# Patient Record
Sex: Female | Born: 1935 | Race: White | Hispanic: No | Marital: Married | Smoking: Former smoker
Health system: Southern US, Community
[De-identification: ages and names within clinical notes are randomized; demographics above are authoritative.]

## PROBLEM LIST (undated history)

## (undated) DIAGNOSIS — M1990 Unspecified osteoarthritis, unspecified site: Secondary | ICD-10-CM

## (undated) DIAGNOSIS — H9190 Unspecified hearing loss, unspecified ear: Secondary | ICD-10-CM

## (undated) DIAGNOSIS — I340 Nonrheumatic mitral (valve) insufficiency: Secondary | ICD-10-CM

## (undated) DIAGNOSIS — H3530 Unspecified macular degeneration: Secondary | ICD-10-CM

## (undated) DIAGNOSIS — F419 Anxiety disorder, unspecified: Secondary | ICD-10-CM

## (undated) DIAGNOSIS — I2510 Atherosclerotic heart disease of native coronary artery without angina pectoris: Secondary | ICD-10-CM

## (undated) DIAGNOSIS — I519 Heart disease, unspecified: Secondary | ICD-10-CM

## (undated) DIAGNOSIS — I779 Disorder of arteries and arterioles, unspecified: Secondary | ICD-10-CM

## (undated) DIAGNOSIS — E875 Hyperkalemia: Secondary | ICD-10-CM

## (undated) DIAGNOSIS — I442 Atrioventricular block, complete: Secondary | ICD-10-CM

## (undated) DIAGNOSIS — Z950 Presence of cardiac pacemaker: Secondary | ICD-10-CM

## (undated) DIAGNOSIS — R55 Syncope and collapse: Secondary | ICD-10-CM

## (undated) DIAGNOSIS — E785 Hyperlipidemia, unspecified: Secondary | ICD-10-CM

## (undated) DIAGNOSIS — I739 Peripheral vascular disease, unspecified: Secondary | ICD-10-CM

## (undated) HISTORY — DX: Disorder of arteries and arterioles, unspecified: I77.9

## (undated) HISTORY — DX: Presence of cardiac pacemaker: Z95.0

## (undated) HISTORY — DX: Nonrheumatic mitral (valve) insufficiency: I34.0

## (undated) HISTORY — PX: PACEMAKER PLACEMENT: SHX43

## (undated) HISTORY — DX: Unspecified osteoarthritis, unspecified site: M19.90

## (undated) HISTORY — DX: Atrioventricular block, complete: I44.2

## (undated) HISTORY — DX: Hyperlipidemia, unspecified: E78.5

## (undated) HISTORY — DX: Hyperkalemia: E87.5

## (undated) HISTORY — DX: Anxiety disorder, unspecified: F41.9

## (undated) HISTORY — DX: Syncope and collapse: R55

## (undated) HISTORY — DX: Unspecified macular degeneration: H35.30

## (undated) HISTORY — DX: Peripheral vascular disease, unspecified: I73.9

## (undated) HISTORY — DX: Atherosclerotic heart disease of native coronary artery without angina pectoris: I25.10

## (undated) HISTORY — DX: Unspecified hearing loss, unspecified ear: H91.90

## (undated) HISTORY — DX: Heart disease, unspecified: I51.9

## (undated) HISTORY — PX: ABDOMINAL HYSTERECTOMY: SHX81

---

## 1993-06-03 HISTORY — PX: CORONARY ARTERY BYPASS GRAFT: SHX141

## 1993-06-03 HISTORY — PX: CAROTID ENDARTERECTOMY: SUR193

## 2000-06-04 ENCOUNTER — Ambulatory Visit (HOSPITAL_COMMUNITY): Admission: RE | Admit: 2000-06-04 | Discharge: 2000-06-04 | Payer: Self-pay | Admitting: Interventional Cardiology

## 2000-06-04 ENCOUNTER — Encounter: Payer: Self-pay | Admitting: Interventional Cardiology

## 2000-06-06 ENCOUNTER — Inpatient Hospital Stay (HOSPITAL_COMMUNITY)
Admission: AD | Admit: 2000-06-06 | Discharge: 2000-06-13 | Payer: Self-pay | Admitting: Thoracic Surgery (Cardiothoracic Vascular Surgery)

## 2000-06-06 ENCOUNTER — Encounter: Payer: Self-pay | Admitting: Surgery

## 2000-06-06 ENCOUNTER — Encounter: Payer: Self-pay | Admitting: Thoracic Surgery (Cardiothoracic Vascular Surgery)

## 2000-06-07 ENCOUNTER — Encounter: Payer: Self-pay | Admitting: Thoracic Surgery (Cardiothoracic Vascular Surgery)

## 2000-06-08 ENCOUNTER — Encounter: Payer: Self-pay | Admitting: Thoracic Surgery (Cardiothoracic Vascular Surgery)

## 2000-06-09 ENCOUNTER — Encounter: Payer: Self-pay | Admitting: Surgery

## 2000-06-10 ENCOUNTER — Encounter: Payer: Self-pay | Admitting: Thoracic Surgery (Cardiothoracic Vascular Surgery)

## 2000-06-11 ENCOUNTER — Encounter: Payer: Self-pay | Admitting: Thoracic Surgery (Cardiothoracic Vascular Surgery)

## 2004-03-15 ENCOUNTER — Encounter: Admission: RE | Admit: 2004-03-15 | Discharge: 2004-03-15 | Payer: Self-pay | Admitting: Internal Medicine

## 2004-05-02 ENCOUNTER — Ambulatory Visit: Payer: Self-pay

## 2004-05-24 ENCOUNTER — Ambulatory Visit: Payer: Self-pay

## 2004-06-23 ENCOUNTER — Ambulatory Visit: Payer: Self-pay | Admitting: Internal Medicine

## 2004-07-17 ENCOUNTER — Ambulatory Visit: Payer: Self-pay | Admitting: Internal Medicine

## 2004-08-06 ENCOUNTER — Ambulatory Visit: Payer: Self-pay | Admitting: Internal Medicine

## 2004-08-17 ENCOUNTER — Ambulatory Visit: Payer: Self-pay | Admitting: Internal Medicine

## 2004-10-24 ENCOUNTER — Ambulatory Visit: Payer: Self-pay | Admitting: Internal Medicine

## 2004-11-27 ENCOUNTER — Ambulatory Visit: Payer: Self-pay | Admitting: Internal Medicine

## 2004-12-03 ENCOUNTER — Ambulatory Visit: Payer: Self-pay | Admitting: Internal Medicine

## 2005-01-04 ENCOUNTER — Ambulatory Visit: Payer: Self-pay | Admitting: Internal Medicine

## 2005-01-09 ENCOUNTER — Ambulatory Visit: Payer: Self-pay | Admitting: Internal Medicine

## 2005-02-13 ENCOUNTER — Ambulatory Visit: Payer: Self-pay | Admitting: Internal Medicine

## 2005-02-18 ENCOUNTER — Ambulatory Visit: Payer: Self-pay | Admitting: Internal Medicine

## 2005-03-01 ENCOUNTER — Ambulatory Visit: Payer: Self-pay | Admitting: Internal Medicine

## 2005-03-06 ENCOUNTER — Ambulatory Visit: Payer: Self-pay

## 2005-03-11 ENCOUNTER — Ambulatory Visit: Payer: Self-pay | Admitting: Internal Medicine

## 2005-03-29 ENCOUNTER — Ambulatory Visit: Payer: Self-pay | Admitting: Internal Medicine

## 2005-05-02 ENCOUNTER — Ambulatory Visit: Payer: Self-pay | Admitting: Internal Medicine

## 2005-06-03 HISTORY — PX: CORONARY ANGIOPLASTY: SHX604

## 2005-06-05 ENCOUNTER — Ambulatory Visit: Payer: Self-pay | Admitting: Internal Medicine

## 2005-06-21 ENCOUNTER — Encounter: Admission: RE | Admit: 2005-06-21 | Discharge: 2005-06-21 | Payer: Self-pay | Admitting: Internal Medicine

## 2005-07-10 ENCOUNTER — Ambulatory Visit: Payer: Self-pay | Admitting: Internal Medicine

## 2005-07-29 ENCOUNTER — Ambulatory Visit: Payer: Self-pay | Admitting: Internal Medicine

## 2005-08-02 ENCOUNTER — Ambulatory Visit: Payer: Self-pay | Admitting: Internal Medicine

## 2005-08-09 ENCOUNTER — Ambulatory Visit: Payer: Self-pay | Admitting: Internal Medicine

## 2005-08-15 ENCOUNTER — Ambulatory Visit: Payer: Self-pay | Admitting: Internal Medicine

## 2005-08-15 ENCOUNTER — Encounter: Payer: Self-pay | Admitting: Cardiology

## 2005-08-15 ENCOUNTER — Ambulatory Visit: Payer: Self-pay

## 2005-09-02 ENCOUNTER — Ambulatory Visit: Payer: Self-pay | Admitting: Internal Medicine

## 2005-10-02 ENCOUNTER — Ambulatory Visit: Payer: Self-pay | Admitting: Internal Medicine

## 2005-11-05 ENCOUNTER — Ambulatory Visit: Payer: Self-pay | Admitting: Internal Medicine

## 2005-12-06 ENCOUNTER — Ambulatory Visit: Payer: Self-pay | Admitting: Internal Medicine

## 2005-12-10 ENCOUNTER — Ambulatory Visit: Payer: Self-pay | Admitting: Internal Medicine

## 2005-12-12 ENCOUNTER — Ambulatory Visit: Payer: Self-pay | Admitting: Internal Medicine

## 2006-01-02 ENCOUNTER — Ambulatory Visit: Payer: Self-pay | Admitting: Internal Medicine

## 2006-02-14 ENCOUNTER — Ambulatory Visit: Payer: Self-pay | Admitting: Internal Medicine

## 2006-03-17 ENCOUNTER — Ambulatory Visit: Payer: Self-pay | Admitting: Internal Medicine

## 2006-04-14 ENCOUNTER — Ambulatory Visit: Payer: Self-pay | Admitting: Internal Medicine

## 2006-05-12 ENCOUNTER — Ambulatory Visit: Payer: Self-pay | Admitting: Internal Medicine

## 2006-05-22 ENCOUNTER — Ambulatory Visit: Payer: Self-pay

## 2006-05-23 ENCOUNTER — Ambulatory Visit: Payer: Self-pay | Admitting: Internal Medicine

## 2006-05-29 ENCOUNTER — Ambulatory Visit: Payer: Self-pay | Admitting: Cardiovascular Disease

## 2006-05-29 ENCOUNTER — Inpatient Hospital Stay (HOSPITAL_BASED_OUTPATIENT_CLINIC_OR_DEPARTMENT_OTHER): Admission: RE | Admit: 2006-05-29 | Discharge: 2006-05-29 | Payer: Self-pay | Admitting: Cardiovascular Disease

## 2006-06-09 ENCOUNTER — Ambulatory Visit: Payer: Self-pay | Admitting: Internal Medicine

## 2006-06-11 ENCOUNTER — Ambulatory Visit: Payer: Self-pay | Admitting: Cardiology

## 2006-07-01 ENCOUNTER — Ambulatory Visit: Payer: Self-pay | Admitting: Internal Medicine

## 2006-07-01 LAB — CONVERTED CEMR LAB
Chloride: 104 meq/L (ref 96–112)
Eosinophils Absolute: 0.2 10*3/uL (ref 0.0–0.6)
Eosinophils Relative: 2.5 % (ref 0.0–5.0)
GFR calc non Af Amer: 56 mL/min
Glucose, Bld: 87 mg/dL (ref 70–99)
HCT: 35.1 % — ABNORMAL LOW (ref 36.0–46.0)
Hemoglobin: 12.1 g/dL (ref 12.0–15.0)
Lymphocytes Relative: 27.5 % (ref 12.0–46.0)
MCV: 89.8 fL (ref 78.0–100.0)
Monocytes Absolute: 0.7 10*3/uL (ref 0.2–0.7)
Neutrophils Relative %: 60.5 % (ref 43.0–77.0)
Platelets: 259 10*3/uL (ref 150–400)
Potassium: 4.6 meq/L (ref 3.5–5.1)
RBC: 3.91 M/uL (ref 3.87–5.11)
Sodium: 139 meq/L (ref 135–145)
WBC: 7.8 10*3/uL (ref 4.5–10.5)

## 2006-07-07 ENCOUNTER — Ambulatory Visit: Payer: Self-pay | Admitting: Internal Medicine

## 2006-07-08 ENCOUNTER — Encounter: Admission: RE | Admit: 2006-07-08 | Discharge: 2006-07-08 | Payer: Self-pay | Admitting: Internal Medicine

## 2006-07-21 ENCOUNTER — Ambulatory Visit: Payer: Self-pay | Admitting: Internal Medicine

## 2006-08-04 ENCOUNTER — Ambulatory Visit: Payer: Self-pay | Admitting: Internal Medicine

## 2006-09-01 ENCOUNTER — Ambulatory Visit: Payer: Self-pay | Admitting: Internal Medicine

## 2006-09-12 ENCOUNTER — Ambulatory Visit: Payer: Self-pay | Admitting: Internal Medicine

## 2006-09-29 ENCOUNTER — Ambulatory Visit: Payer: Self-pay | Admitting: Internal Medicine

## 2006-10-24 ENCOUNTER — Ambulatory Visit: Payer: Self-pay | Admitting: Internal Medicine

## 2006-10-24 ENCOUNTER — Encounter: Admission: RE | Admit: 2006-10-24 | Discharge: 2006-10-24 | Payer: Self-pay | Admitting: Internal Medicine

## 2006-10-30 ENCOUNTER — Ambulatory Visit: Payer: Self-pay | Admitting: Internal Medicine

## 2006-11-19 ENCOUNTER — Ambulatory Visit: Payer: Self-pay | Admitting: Family Medicine

## 2006-11-24 ENCOUNTER — Ambulatory Visit: Payer: Self-pay | Admitting: Internal Medicine

## 2006-12-24 ENCOUNTER — Ambulatory Visit: Payer: Self-pay | Admitting: Internal Medicine

## 2006-12-24 DIAGNOSIS — I10 Essential (primary) hypertension: Secondary | ICD-10-CM | POA: Insufficient documentation

## 2006-12-24 DIAGNOSIS — I2510 Atherosclerotic heart disease of native coronary artery without angina pectoris: Secondary | ICD-10-CM | POA: Insufficient documentation

## 2006-12-24 DIAGNOSIS — M1990 Unspecified osteoarthritis, unspecified site: Secondary | ICD-10-CM | POA: Insufficient documentation

## 2006-12-26 ENCOUNTER — Encounter: Payer: Self-pay | Admitting: Internal Medicine

## 2007-01-19 ENCOUNTER — Ambulatory Visit: Payer: Self-pay | Admitting: Internal Medicine

## 2007-01-29 ENCOUNTER — Ambulatory Visit: Payer: Self-pay | Admitting: Internal Medicine

## 2007-02-03 ENCOUNTER — Telehealth (INDEPENDENT_AMBULATORY_CARE_PROVIDER_SITE_OTHER): Payer: Self-pay | Admitting: *Deleted

## 2007-02-09 ENCOUNTER — Telehealth: Payer: Self-pay | Admitting: Internal Medicine

## 2007-02-16 ENCOUNTER — Ambulatory Visit: Payer: Self-pay | Admitting: Internal Medicine

## 2007-03-02 ENCOUNTER — Telehealth: Payer: Self-pay | Admitting: Internal Medicine

## 2007-03-18 ENCOUNTER — Ambulatory Visit: Payer: Self-pay | Admitting: Internal Medicine

## 2007-03-19 ENCOUNTER — Telehealth: Payer: Self-pay | Admitting: Internal Medicine

## 2007-03-20 ENCOUNTER — Ambulatory Visit: Payer: Self-pay | Admitting: Internal Medicine

## 2007-03-20 DIAGNOSIS — R109 Unspecified abdominal pain: Secondary | ICD-10-CM | POA: Insufficient documentation

## 2007-03-20 LAB — CONVERTED CEMR LAB
Ketones, urine, test strip: NEGATIVE
Nitrite: NEGATIVE
Specific Gravity, Urine: <1.005
Urobilinogen, UA: 0.2

## 2007-04-13 ENCOUNTER — Ambulatory Visit: Payer: Self-pay | Admitting: Internal Medicine

## 2007-04-22 ENCOUNTER — Ambulatory Visit: Payer: Self-pay | Admitting: Internal Medicine

## 2007-04-23 ENCOUNTER — Ambulatory Visit: Payer: Self-pay | Admitting: Internal Medicine

## 2007-05-06 ENCOUNTER — Telehealth: Payer: Self-pay | Admitting: Internal Medicine

## 2007-05-07 ENCOUNTER — Telehealth: Payer: Self-pay | Admitting: *Deleted

## 2007-05-11 ENCOUNTER — Ambulatory Visit: Payer: Self-pay | Admitting: Internal Medicine

## 2007-05-19 ENCOUNTER — Telehealth: Payer: Self-pay | Admitting: Internal Medicine

## 2007-06-08 ENCOUNTER — Ambulatory Visit: Payer: Self-pay | Admitting: Internal Medicine

## 2007-06-10 ENCOUNTER — Ambulatory Visit: Payer: Self-pay | Admitting: Internal Medicine

## 2007-06-10 DIAGNOSIS — Z950 Presence of cardiac pacemaker: Secondary | ICD-10-CM | POA: Insufficient documentation

## 2007-06-10 DIAGNOSIS — R209 Unspecified disturbances of skin sensation: Secondary | ICD-10-CM

## 2007-06-10 DIAGNOSIS — Z951 Presence of aortocoronary bypass graft: Secondary | ICD-10-CM

## 2007-06-19 ENCOUNTER — Ambulatory Visit: Payer: Self-pay | Admitting: Internal Medicine

## 2007-07-06 ENCOUNTER — Telehealth: Payer: Self-pay | Admitting: Internal Medicine

## 2007-07-13 ENCOUNTER — Ambulatory Visit: Payer: Self-pay | Admitting: Internal Medicine

## 2007-07-14 ENCOUNTER — Telehealth: Payer: Self-pay | Admitting: Internal Medicine

## 2007-07-15 ENCOUNTER — Telehealth: Payer: Self-pay | Admitting: Internal Medicine

## 2007-07-21 ENCOUNTER — Ambulatory Visit: Payer: Self-pay | Admitting: Internal Medicine

## 2007-07-27 ENCOUNTER — Encounter: Payer: Self-pay | Admitting: Internal Medicine

## 2007-07-30 ENCOUNTER — Encounter: Admission: RE | Admit: 2007-07-30 | Discharge: 2007-07-30 | Payer: Self-pay | Admitting: Orthopedic Surgery

## 2007-08-03 ENCOUNTER — Encounter: Payer: Self-pay | Admitting: Internal Medicine

## 2007-09-01 ENCOUNTER — Encounter: Admission: RE | Admit: 2007-09-01 | Discharge: 2007-09-01 | Payer: Self-pay | Admitting: Internal Medicine

## 2007-09-07 ENCOUNTER — Ambulatory Visit: Payer: Self-pay | Admitting: Internal Medicine

## 2007-09-08 ENCOUNTER — Telehealth: Payer: Self-pay | Admitting: Internal Medicine

## 2007-09-14 ENCOUNTER — Telehealth: Payer: Self-pay | Admitting: *Deleted

## 2007-09-25 ENCOUNTER — Ambulatory Visit: Payer: Self-pay | Admitting: Internal Medicine

## 2007-09-25 DIAGNOSIS — R1031 Right lower quadrant pain: Secondary | ICD-10-CM

## 2007-09-25 DIAGNOSIS — K5900 Constipation, unspecified: Secondary | ICD-10-CM | POA: Insufficient documentation

## 2007-10-06 ENCOUNTER — Ambulatory Visit: Payer: Self-pay | Admitting: Internal Medicine

## 2007-10-06 LAB — CONVERTED CEMR LAB
Albumin: 3.9 g/dL (ref 3.5–5.2)
Basophils Absolute: 0 10*3/uL (ref 0.0–0.1)
CO2: 33 meq/L — ABNORMAL HIGH (ref 19–32)
Chloride: 107 meq/L (ref 96–112)
Creatinine, Ser: 1.1 mg/dL (ref 0.4–1.2)
Eosinophils Relative: 2.1 % (ref 0.0–5.0)
Glucose, Bld: 112 mg/dL — ABNORMAL HIGH (ref 70–99)
HCT: 35.5 % — ABNORMAL LOW (ref 36.0–46.0)
MCHC: 33.9 g/dL (ref 30.0–36.0)
MCV: 92.3 fL (ref 78.0–100.0)
Monocytes Relative: 7.2 % (ref 3.0–12.0)
Nitrite: NEGATIVE
Platelets: 266 10*3/uL (ref 150–400)
Potassium: 4.8 meq/L (ref 3.5–5.1)
RBC: 3.84 M/uL — ABNORMAL LOW (ref 3.87–5.11)
RDW: 12.5 % (ref 11.5–14.6)
Sodium: 144 meq/L (ref 135–145)
Total Bilirubin: 1 mg/dL (ref 0.3–1.2)
Urobilinogen, UA: 0.2
WBC: 7.6 10*3/uL (ref 4.5–10.5)
pH: 5.5

## 2007-10-12 ENCOUNTER — Telehealth: Payer: Self-pay | Admitting: *Deleted

## 2007-10-14 LAB — CONVERTED CEMR LAB: Vit D, 1,25-Dihydroxy: 34 (ref 30–89)

## 2007-10-29 ENCOUNTER — Telehealth: Payer: Self-pay | Admitting: *Deleted

## 2007-12-07 ENCOUNTER — Ambulatory Visit: Payer: Self-pay | Admitting: Internal Medicine

## 2007-12-08 ENCOUNTER — Telehealth: Payer: Self-pay | Admitting: Internal Medicine

## 2007-12-25 ENCOUNTER — Telehealth: Payer: Self-pay | Admitting: Internal Medicine

## 2008-01-05 ENCOUNTER — Telehealth: Payer: Self-pay | Admitting: Internal Medicine

## 2008-01-07 ENCOUNTER — Ambulatory Visit: Payer: Self-pay | Admitting: Internal Medicine

## 2008-01-07 DIAGNOSIS — R5381 Other malaise: Secondary | ICD-10-CM

## 2008-01-07 DIAGNOSIS — R5383 Other fatigue: Secondary | ICD-10-CM

## 2008-01-14 ENCOUNTER — Ambulatory Visit: Payer: Self-pay

## 2008-01-16 ENCOUNTER — Encounter: Payer: Self-pay | Admitting: Internal Medicine

## 2008-01-18 ENCOUNTER — Telehealth: Payer: Self-pay | Admitting: Internal Medicine

## 2008-01-28 ENCOUNTER — Encounter: Payer: Self-pay | Admitting: Internal Medicine

## 2008-02-09 ENCOUNTER — Ambulatory Visit: Payer: Self-pay | Admitting: Internal Medicine

## 2008-02-09 LAB — CONVERTED CEMR LAB
Basophils Relative: 0.5 % (ref 0.0–3.0)
Eosinophils Absolute: 0.2 10*3/uL (ref 0.0–0.7)
Eosinophils Relative: 1.9 % (ref 0.0–5.0)
Hemoglobin: 12.4 g/dL (ref 12.0–15.0)
Lymphocytes Relative: 31.2 % (ref 12.0–46.0)
Monocytes Relative: 6.5 % (ref 3.0–12.0)
RBC: 3.94 M/uL (ref 3.87–5.11)
RDW: 12 % (ref 11.5–14.6)
WBC: 9.4 10*3/uL (ref 4.5–10.5)

## 2008-02-15 ENCOUNTER — Telehealth: Payer: Self-pay | Admitting: Internal Medicine

## 2008-02-29 ENCOUNTER — Telehealth (INDEPENDENT_AMBULATORY_CARE_PROVIDER_SITE_OTHER): Payer: Self-pay | Admitting: *Deleted

## 2008-03-07 ENCOUNTER — Ambulatory Visit: Payer: Self-pay | Admitting: Internal Medicine

## 2008-03-17 ENCOUNTER — Ambulatory Visit: Payer: Self-pay

## 2008-04-26 ENCOUNTER — Telehealth: Payer: Self-pay | Admitting: Internal Medicine

## 2008-05-11 ENCOUNTER — Ambulatory Visit: Payer: Self-pay | Admitting: Internal Medicine

## 2008-05-31 ENCOUNTER — Telehealth: Payer: Self-pay | Admitting: *Deleted

## 2008-06-06 ENCOUNTER — Ambulatory Visit: Payer: Self-pay | Admitting: Internal Medicine

## 2008-06-21 ENCOUNTER — Ambulatory Visit: Payer: Self-pay

## 2008-06-21 ENCOUNTER — Ambulatory Visit: Payer: Self-pay | Admitting: Internal Medicine

## 2008-06-21 DIAGNOSIS — F4322 Adjustment disorder with anxiety: Secondary | ICD-10-CM

## 2008-07-06 ENCOUNTER — Ambulatory Visit: Payer: Self-pay | Admitting: Internal Medicine

## 2008-08-24 ENCOUNTER — Encounter: Payer: Self-pay | Admitting: Internal Medicine

## 2008-08-31 ENCOUNTER — Ambulatory Visit: Payer: Self-pay

## 2008-09-06 ENCOUNTER — Ambulatory Visit: Payer: Self-pay | Admitting: Internal Medicine

## 2008-09-06 ENCOUNTER — Encounter: Payer: Self-pay | Admitting: Internal Medicine

## 2008-09-06 LAB — CONVERTED CEMR LAB
AST: 24 units/L (ref 0–37)
Alkaline Phosphatase: 74 units/L (ref 39–117)
Amylase: 92 units/L (ref 27–131)
BUN: 25 mg/dL — ABNORMAL HIGH (ref 6–23)
Basophils Absolute: 0.3 10*3/uL — ABNORMAL HIGH (ref 0.0–0.1)
Basophils Relative: 3.3 % — ABNORMAL HIGH (ref 0.0–3.0)
CO2: 32 meq/L (ref 19–32)
Calcium: 9.3 mg/dL (ref 8.4–10.5)
Eosinophils Absolute: 0.2 10*3/uL (ref 0.0–0.7)
Eosinophils Relative: 2.5 % (ref 0.0–5.0)
GFR calc non Af Amer: 55.89 mL/min (ref >60)
HCT: 35.7 % — ABNORMAL LOW (ref 36.0–46.0)
Hemoglobin: 12.1 g/dL (ref 12.0–15.0)
Lipase: 33 units/L (ref 11.0–59.0)
Neutrophils Relative %: 59.2 % (ref 43.0–77.0)
Platelets: 233 10*3/uL (ref 150.0–400.0)
Prothrombin Time: 11.9 s (ref 10.9–13.3)
Total Protein: 6.8 g/dL (ref 6.0–8.3)
WBC: 8.6 10*3/uL (ref 4.5–10.5)
aPTT: 31.2 s — ABNORMAL HIGH (ref 21.7–28.8)

## 2008-09-13 ENCOUNTER — Telehealth: Payer: Self-pay | Admitting: Internal Medicine

## 2008-09-14 ENCOUNTER — Ambulatory Visit: Payer: Self-pay | Admitting: Internal Medicine

## 2008-09-14 ENCOUNTER — Ambulatory Visit (HOSPITAL_COMMUNITY): Admission: RE | Admit: 2008-09-14 | Discharge: 2008-09-14 | Payer: Self-pay | Admitting: Internal Medicine

## 2008-09-15 ENCOUNTER — Encounter: Payer: Self-pay | Admitting: Internal Medicine

## 2008-09-28 ENCOUNTER — Ambulatory Visit: Payer: Self-pay

## 2008-09-28 ENCOUNTER — Encounter: Payer: Self-pay | Admitting: Internal Medicine

## 2008-10-04 ENCOUNTER — Ambulatory Visit: Payer: Self-pay | Admitting: Internal Medicine

## 2008-10-05 ENCOUNTER — Telehealth (INDEPENDENT_AMBULATORY_CARE_PROVIDER_SITE_OTHER): Payer: Self-pay | Admitting: *Deleted

## 2008-10-06 ENCOUNTER — Ambulatory Visit: Payer: Self-pay | Admitting: Internal Medicine

## 2008-10-11 ENCOUNTER — Telehealth: Payer: Self-pay | Admitting: Internal Medicine

## 2008-11-21 ENCOUNTER — Ambulatory Visit: Payer: Self-pay | Admitting: Internal Medicine

## 2008-11-21 DIAGNOSIS — L259 Unspecified contact dermatitis, unspecified cause: Secondary | ICD-10-CM | POA: Insufficient documentation

## 2008-12-06 ENCOUNTER — Telehealth: Payer: Self-pay | Admitting: Internal Medicine

## 2008-12-20 ENCOUNTER — Telehealth: Payer: Self-pay | Admitting: Internal Medicine

## 2009-01-09 ENCOUNTER — Telehealth: Payer: Self-pay | Admitting: *Deleted

## 2009-01-16 ENCOUNTER — Encounter: Payer: Self-pay | Admitting: Internal Medicine

## 2009-01-31 ENCOUNTER — Ambulatory Visit: Payer: Self-pay | Admitting: Internal Medicine

## 2009-01-31 DIAGNOSIS — I359 Nonrheumatic aortic valve disorder, unspecified: Secondary | ICD-10-CM | POA: Insufficient documentation

## 2009-02-02 ENCOUNTER — Telehealth: Payer: Self-pay | Admitting: Internal Medicine

## 2009-02-07 ENCOUNTER — Telehealth: Payer: Self-pay | Admitting: *Deleted

## 2009-02-14 ENCOUNTER — Encounter: Payer: Self-pay | Admitting: Internal Medicine

## 2009-02-14 ENCOUNTER — Ambulatory Visit: Payer: Self-pay

## 2009-02-16 ENCOUNTER — Telehealth: Payer: Self-pay | Admitting: Internal Medicine

## 2009-02-28 ENCOUNTER — Telehealth: Payer: Self-pay | Admitting: *Deleted

## 2009-03-31 ENCOUNTER — Ambulatory Visit: Payer: Self-pay | Admitting: Internal Medicine

## 2009-04-07 ENCOUNTER — Telehealth: Payer: Self-pay | Admitting: *Deleted

## 2009-04-24 ENCOUNTER — Ambulatory Visit: Payer: Self-pay | Admitting: Internal Medicine

## 2009-04-25 ENCOUNTER — Encounter (INDEPENDENT_AMBULATORY_CARE_PROVIDER_SITE_OTHER): Payer: Self-pay | Admitting: *Deleted

## 2009-05-01 ENCOUNTER — Telehealth: Payer: Self-pay | Admitting: Internal Medicine

## 2009-05-03 HISTORY — PX: OTHER SURGICAL HISTORY: SHX169

## 2009-05-09 ENCOUNTER — Telehealth: Payer: Self-pay | Admitting: Internal Medicine

## 2009-05-10 ENCOUNTER — Encounter: Payer: Self-pay | Admitting: Internal Medicine

## 2009-06-06 ENCOUNTER — Telehealth (INDEPENDENT_AMBULATORY_CARE_PROVIDER_SITE_OTHER): Payer: Self-pay | Admitting: *Deleted

## 2009-06-29 ENCOUNTER — Encounter: Payer: Self-pay | Admitting: Internal Medicine

## 2009-06-29 ENCOUNTER — Ambulatory Visit: Payer: Self-pay

## 2009-08-28 ENCOUNTER — Telehealth: Payer: Self-pay | Admitting: Internal Medicine

## 2009-09-12 ENCOUNTER — Ambulatory Visit: Payer: Self-pay | Admitting: Internal Medicine

## 2009-09-12 DIAGNOSIS — I498 Other specified cardiac arrhythmias: Secondary | ICD-10-CM | POA: Insufficient documentation

## 2009-09-29 ENCOUNTER — Ambulatory Visit: Payer: Self-pay | Admitting: Internal Medicine

## 2009-09-29 DIAGNOSIS — E785 Hyperlipidemia, unspecified: Secondary | ICD-10-CM

## 2009-10-02 LAB — CONVERTED CEMR LAB
ALT: 17 units/L (ref 0–35)
Albumin: 4 g/dL (ref 3.5–5.2)
Alkaline Phosphatase: 65 units/L (ref 39–117)
Basophils Absolute: 0.1 10*3/uL (ref 0.0–0.1)
Basophils Relative: 0.8 % (ref 0.0–3.0)
Calcium: 9.3 mg/dL (ref 8.4–10.5)
Cholesterol: 140 mg/dL (ref 0–200)
Eosinophils Absolute: 0.1 10*3/uL (ref 0.0–0.7)
HCT: 37.4 % (ref 36.0–46.0)
HDL: 53.9 mg/dL (ref >39.00)
Hemoglobin: 12.6 g/dL (ref 12.0–15.0)
LDL Cholesterol: 58 mg/dL (ref 0–99)
Lymphs Abs: 2.6 10*3/uL (ref 0.7–4.0)
Neutro Abs: 5.7 10*3/uL (ref 1.4–7.7)
RBC: 3.99 M/uL (ref 3.87–5.11)
Total CHOL/HDL Ratio: 3
Total Protein: 7.3 g/dL (ref 6.0–8.3)
VLDL: 28.2 mg/dL (ref 0.0–40.0)
WBC: 9.1 10*3/uL (ref 4.5–10.5)

## 2009-10-03 ENCOUNTER — Encounter: Payer: Self-pay | Admitting: Internal Medicine

## 2009-10-03 ENCOUNTER — Ambulatory Visit: Payer: Self-pay | Admitting: Internal Medicine

## 2009-10-09 ENCOUNTER — Telehealth: Payer: Self-pay | Admitting: *Deleted

## 2009-10-16 ENCOUNTER — Encounter: Admission: RE | Admit: 2009-10-16 | Discharge: 2009-10-16 | Payer: Self-pay | Admitting: Internal Medicine

## 2009-10-17 ENCOUNTER — Ambulatory Visit: Payer: Self-pay | Admitting: Internal Medicine

## 2009-11-20 ENCOUNTER — Telehealth: Payer: Self-pay | Admitting: *Deleted

## 2009-11-28 ENCOUNTER — Ambulatory Visit: Payer: Self-pay | Admitting: Internal Medicine

## 2009-11-28 DIAGNOSIS — F438 Other reactions to severe stress: Secondary | ICD-10-CM | POA: Insufficient documentation

## 2009-12-01 ENCOUNTER — Telehealth: Payer: Self-pay | Admitting: Internal Medicine

## 2009-12-07 ENCOUNTER — Ambulatory Visit: Payer: Self-pay

## 2009-12-07 ENCOUNTER — Encounter: Payer: Self-pay | Admitting: Internal Medicine

## 2009-12-18 ENCOUNTER — Telehealth: Payer: Self-pay | Admitting: *Deleted

## 2010-02-01 ENCOUNTER — Telehealth: Payer: Self-pay | Admitting: *Deleted

## 2010-02-28 ENCOUNTER — Ambulatory Visit: Payer: Self-pay | Admitting: Internal Medicine

## 2010-03-26 ENCOUNTER — Telehealth: Payer: Self-pay | Admitting: Internal Medicine

## 2010-04-05 ENCOUNTER — Ambulatory Visit: Payer: Self-pay | Admitting: Internal Medicine

## 2010-04-05 LAB — CONVERTED CEMR LAB
AST: 23 units/L (ref 0–37)
BUN: 19 mg/dL (ref 6–23)
Basophils Absolute: 0 10*3/uL (ref 0.0–0.1)
CO2: 34 meq/L — ABNORMAL HIGH (ref 19–32)
Chloride: 101 meq/L (ref 96–112)
Creatinine, Ser: 0.9 mg/dL (ref 0.4–1.2)
Eosinophils Relative: 2.7 % (ref 0.0–5.0)
HCT: 33.8 % — ABNORMAL LOW (ref 36.0–46.0)
MCHC: 34.3 g/dL (ref 30.0–36.0)
Monocytes Relative: 9.3 % (ref 3.0–12.0)
Neutro Abs: 4.1 10*3/uL (ref 1.4–7.7)
Neutrophils Relative %: 51.3 % (ref 43.0–77.0)
Platelets: 235 10*3/uL (ref 150.0–400.0)
RBC: 3.59 M/uL — ABNORMAL LOW (ref 3.87–5.11)
RDW: 13.7 % (ref 11.5–14.6)
Vitamin B-12: 646 pg/mL (ref 211–911)

## 2010-04-12 ENCOUNTER — Ambulatory Visit: Payer: Self-pay | Admitting: Internal Medicine

## 2010-04-12 DIAGNOSIS — F4323 Adjustment disorder with mixed anxiety and depressed mood: Secondary | ICD-10-CM

## 2010-04-12 DIAGNOSIS — D649 Anemia, unspecified: Secondary | ICD-10-CM

## 2010-04-20 ENCOUNTER — Telehealth: Payer: Self-pay | Admitting: *Deleted

## 2010-04-30 ENCOUNTER — Telehealth: Payer: Self-pay | Admitting: *Deleted

## 2010-05-11 ENCOUNTER — Telehealth: Payer: Self-pay | Admitting: Internal Medicine

## 2010-05-29 ENCOUNTER — Telehealth: Payer: Self-pay | Admitting: *Deleted

## 2010-06-05 ENCOUNTER — Encounter: Payer: Self-pay | Admitting: Internal Medicine

## 2010-06-05 ENCOUNTER — Ambulatory Visit
Admission: RE | Admit: 2010-06-05 | Discharge: 2010-06-05 | Payer: Self-pay | Source: Home / Self Care | Attending: Internal Medicine | Admitting: Internal Medicine

## 2010-06-05 DIAGNOSIS — I442 Atrioventricular block, complete: Secondary | ICD-10-CM | POA: Insufficient documentation

## 2010-06-27 ENCOUNTER — Encounter: Payer: Self-pay | Admitting: Internal Medicine

## 2010-06-29 ENCOUNTER — Ambulatory Visit: Admission: RE | Admit: 2010-06-29 | Discharge: 2010-06-29 | Payer: Self-pay | Source: Home / Self Care

## 2010-06-29 ENCOUNTER — Encounter: Payer: Self-pay | Admitting: Internal Medicine

## 2010-07-03 NOTE — Assessment & Plan Note (Signed)
Summary: follow on labs/cjr   Vital Signs:  Patient profile:   77 year old female Menstrual status:  hysterectomy Weight:      147 pounds Pulse rate:   78 / minute BP sitting:   120 / 70  (right arm) Cuff size:   regular  Vitals Entered By: Romualdo Bolk, CMA (AAMA) (April 12, 2010 9:58 AM) CC: Follow-up visit on labs, Hypertension Management, pt is under alot of stress due to taking care of husband. Then on 11/9 she had her neck beating hard. Pt is having rt sided pain when she stands a long time.    History of Present Illness: Analyssa [name]   comes in today  for  fatigue  issues .  See phone note.    Anxiety: she's taking one and half the Xanax today in this helps her. Her daughter who is a Engineer, civil (consulting) suggested she consider an antidepressant medication. She is unsure if she should do this as she is " on so many medications".   she is very stressed in regard to her husband who is physically healthy but has some dementia and does things dangerous, such as getting up on roofs to clean even though  he is clinically blind.     there are also family issues and hard feelings with her children..  CV : no chest pain increasing shortness of breath episode of left neck pounding responding to an aspirin last night. No pain or vision changes. Fatigue : she's unsure if this is from stress. She has no bleeding. She used to take her vitamins with iron. Currently none.   Hypertension History:      She complains of palpitations and dyspnea with exertion, but denies headache, chest pain, orthopnea, PND, peripheral edema, visual symptoms, neurologic problems, syncope, and side effects from treatment.  She notes no problems with any antihypertensive medication side effects.  occ heart palps due to stress.        Positive major cardiovascular risk factors include female age 74 years old or older, hyperlipidemia, and hypertension.  Negative major cardiovascular risk factors include non-tobacco-user status.         Positive history for target organ damage include ASHD (either angina/prior MI/prior CABG).     Preventive Screening-Counseling & Management  Alcohol-Tobacco     Alcohol drinks/day: 0     Smoking Status: never  Caffeine-Diet-Exercise     Caffeine use/day: 1     Does Patient Exercise: yes  Hep-HIV-STD-Contraception     Dental Visit-last 6 months yes     Sun Exposure-Excessive: no  Safety-Violence-Falls     Seat Belt Use: yes     Firearms in the Home: firearms in the home     Firearm Counseling: not indicated; uses recommended firearm safety measures     Smoke Detectors: yes     Fall Risk: no falls      Fall Risk Counseling: disc   Current Medications (verified): 1)  Aspir-81 81 Mg Tbec (Aspirin) .... 4 Tablets Per Week 2)  Carvedilol 12.5 Mg Tabs (Carvedilol) .... Take One Tablet By Mouth Twice A Day 3)  Isosorbide Mononitrate Cr 30 Mg Tb24 (Isosorbide Mononitrate) .... Take 1/2 Tablet By Mouth Once A Day 4)  Lipitor 80 Mg Tabs (Atorvastatin Calcium) .Marland Kitchen.. 1 Tab By Mouth Once Daily 5)  Centrum Silver   Tabs (Multiple Vitamins-Minerals) .Marland Kitchen.. 1 By Mouth Once Daily 6)  Alprazolam 0.25 Mg Tbdp (Alprazolam) .... 1/2 Qam and 1 At Bedtime or As Directed 7)  Vitamin D 1000 Unit Tabs (Cholecalciferol) 8)  Tylenol Pm Extra Strength 500-25 Mg Tabs (Diphenhydramine-Apap (Sleep)) 9)  Furosemide 20 Mg Tabs (Furosemide) .Marland Kitchen.. 1 Tab Once Daily 10)  Icaps  Caps (Multiple Vitamins-Minerals)  Allergies (verified): 1)  ! Ibuprofen 2)  ! Hydrocodone  Past History:  Past medical, surgical, family and social histories (including risk factors) reviewed, and no changes noted (except as noted below).  Past Medical History: Reviewed history from 09/29/2009 and no changes required. Coronary artery disease LV Dysfunction intercurrent normalization of LV function cath 2007 Moderate-severe Mitral regurgitation Syncope Complete Heart Block  Pacemaker-BiV (S/P) Carotid artery disease--s/p  endarterectomy Dyslipidemia Hypertension Osteoarthritis Decreased hearing  Hyperkalemia  Macular degeneration--impaired vision Decreased hearing left ear G4P4 Anxiety : on  two times a day xanax since 2007 Consults [name]  Past Surgical History: Reviewed history from 09/29/2009 and no changes required. Coronary artery bypass graft x4 95 Hysterectomy Permanent pacemaker95  replaced  April 2010 Carotid endarterectomy95 Right  knee surgery  Dr Sherlean Foot.  Dec 2010   Past History:  Care Management: Cardiology: [name]  Orthopedics: [place]-in the past,Lucy  Family History: Reviewed history from 06/21/2008 and no changes required. daughter died of melanoma Father: grangerene in one leg Mother: cerebral hemorrage Siblings: no health problems   Social History: Reviewed history from 11/28/2009 and no changes required. Married    hhof 2  Tobacco Use - No.  Alcohol Use - yes husband had aneurysm surgery in FEbruary  2010 is legally blind caretaker for him  he is now on"memory medicine" other family stresses     Review of Systems       no bleeding  gi gu abd pain other than the rla when walking.    no cp sob   no syncope .  no uti signs    rest as per HPI  Physical Exam  General:  Well-developed,well-nourished,in no acute distress; alert,appropriate and cooperative throughout examination well groomed  looks well  Head:  normocephalic and atraumatic.   Eyes:  clear  no discharge  Ears:  hard of hearing   Neck:  well healed scar left neck  new tenderness masses are bruits heard Lungs:  Normal respiratory effort, chest expands symmetrically. Lungs are clear to auscultation, no crackles or wheezes. Heart:  Normal rate and regular rhythm. S1 and S2 normal without gallop, murmur, click, rub or other extra sounds. Abdomen:  Bowel sounds positive,abdomen soft and non-tender without masses, organomegaly or hernias noted.   Extremities:  1+ left pedal edema and 1+ right pedal  edema.   Neurologic:  Pt is A&Ox3,affect,speech,memory,attention,&motor skills appear intact.  Skin:  turgor normal and color normal.   Cervical Nodes:  No lymphadenopathy noted Psych:  Oriented X3, normally interactive, and good eye contact.  mildly anxious and  animated  ocass sad when discussing  situation   Impression & Recommendations:  Problem # 1:  FATIGUE (ICD-780.79) multifactorial although I do believe that stress is a big part of this today. She is mildly anemic.  No evidence of bleeding will follow closely. She is on aspirin.  Problem # 2:  ANEMIA, MILD (ICD-285.9) mild    no acitve bleeding an other alarm featuses ? from asa    add iron to vits  and follow carefully  Problem # 3:  ADJ DISORDER WITH MIXED ANXIETY & DEPRESSED MOOD (ICD-309.28) Discussed risk benefit   of medications.    if wishes to start can fu here .    sthere are  many external factors triggering mass. Family members suggested treatment.  Problem # 4:  ABDOMINAL PAIN RIGHT LOWER QUADRANT (ICD-789.03) no obv hernia only when  up an around for a while  will follow this  she thinks its stress.  this is been here before. It is not persistent progressive associated with anything but walking a long time.  Complete Medication List: 1)  Aspir-81 81 Mg Tbec (Aspirin) .... 4 tablets per week 2)  Carvedilol 12.5 Mg Tabs (Carvedilol) .... Take one tablet by mouth twice a day 3)  Isosorbide Mononitrate Cr 30 Mg Tb24 (Isosorbide mononitrate) .... Take 1/2 tablet by mouth once a day 4)  Lipitor 80 Mg Tabs (Atorvastatin calcium) .Marland Kitchen.. 1 tab by mouth once daily 5)  Centrum Silver Tabs (Multiple vitamins-minerals) .Marland Kitchen.. 1 by mouth once daily 6)  Alprazolam 0.25 Mg Tbdp (Alprazolam) .... 1/2 qam and 1 at bedtime or as directed 7)  Vitamin D 1000 Unit Tabs (Cholecalciferol) 8)  Tylenol Pm Extra Strength 500-25 Mg Tabs (Diphenhydramine-apap (sleep)) 9)  Furosemide 20 Mg Tabs (Furosemide) .Marland Kitchen.. 1 tab once daily 10)  Icaps Caps  (Multiple vitamins-minerals) 11)  Sertraline Hcl 25 Mg Tabs (Sertraline hcl) .Marland Kitchen.. 1 by mouth once daily for 2 weeks then increase to 2 by mouth once daily  as directed  Hypertension Assessment/Plan:      The patient's hypertensive risk group is category C: Target organ damage and/or diabetes.  Today's blood pressure is 120/70.  Her blood pressure goal is < 140/90.  Patient Instructions: 1)  you are  slightly anemic  . 2)  take iron supplement   and   repeat CBCdiff and IBC in 2 months.  and then ROV 3)  Consider  taking the antidepressant that can help worry and  depression. 4)  Need to take it every day to have it help.and should notice improvement in 2-3 weeks . 5)  ROV in   3-4 weeks    after beginning medication  Prescriptions: SERTRALINE HCL 25 MG TABS (SERTRALINE HCL) 1 by mouth once daily for 2 weeks then increase to 2 by mouth once daily  as directed  #60 x 1   Entered and Authorized by:   Madelin Headings MD   Signed by:   Madelin Headings MD on 04/12/2010   Method used:   Print then Give to Patient   RxID:   [id number]    Orders Added: 1)  Est. Patient Level IV [57846]

## 2010-07-03 NOTE — Progress Notes (Signed)
Summary: REFILL  Phone Note Refill Request Message from:  Patient on August 28, 2009 1:37 PM  Refills Requested: Medication #1:  CARVEDILOL 12.5 MG TABS Take one tablet by mouth twice a day [facility] TO [facility] [address] [phone]  Initial call taken by: Judie Grieve,  August 28, 2009 1:38 PM  Follow-up for Phone Call        Rx sent in to pharmacy on 08/25/2009/ Resent today 08/28/2009.  AT, CMA Follow-up by: Judithe Modest CMA,  August 28, 2009 2:32 PM    Prescriptions: CARVEDILOL 12.5 MG TABS (CARVEDILOL) Take one tablet by mouth twice a day  #60 Tablet x 5   Entered by:   Judithe Modest CMA   Authorized by:   Nathen May, MD, [facility]   Signed by:   Judithe Modest CMA on 08/28/2009   Method used:   [facility]        [phone] (retail)       [address] [place], [place]  [address]       Ph: [phone] or [phone]       Fax: [phone]   RxID:   [id number]

## 2010-07-03 NOTE — Letter (Signed)
Summary: [facility] Generic Letter  [facility] Generic Letter   Imported By: Roderic Ovens 07/10/2009 14:04:16  _____________________________________________________________________  External Attachment:    Type:   Image     Comment:   External Document

## 2010-07-03 NOTE — Progress Notes (Signed)
Summary: Pt is req to get lab work done re: low energy lvls  Phone Note Call from Patient Call back at [facility] Phone [phone]   Caller: Patient Summary of Call: Pt called and said that her energy lvl has been really low. Pt is req to come in a get lab work done. Pls advise.  Initial call taken by: Lucy Antigua,  March 26, 2010 9:29 AM  Follow-up for Phone Call        LMTOCB Follow-up by: Romualdo Bolk, CMA Duncan Dull),  March 26, 2010 11:42 AM  Additional Follow-up for Phone Call Additional follow up Details #1::        Spoke to pt and she states that she has no energy  to do things. Pt states that she can't move. Pt is concerned that her Hgb could be low. Additional Follow-up by: Romualdo Bolk, CMA Duncan Dull),  March 26, 2010 3:59 PM    Additional Follow-up for Phone Call Additional follow up Details #2::    ok to do cbc diff , bmp, B12 /folate ,   Ferritin,   esr.    alt,ast,     and U/A dx fatigue .  and then OV to doscuss the results . Follow-up by: Madelin Headings MD,  March 27, 2010 5:20 PM  Additional Follow-up for Phone Call Additional follow up Details #3:: Details for Additional Follow-up Action Taken: I called pt and sch her for the labs noted above and a fup ov to discuss lab results.  Additional Follow-up by: Lucy Antigua,  March 30, 2010 9:26 AM

## 2010-07-03 NOTE — Assessment & Plan Note (Signed)
Summary: bp check/ssc  Nurse Visit   Vital Signs:  Patient profile:   77 year old female Menstrual status:  hysterectomy Pulse rate:   92 / minute BP sitting:   120 / 70  (left arm) Cuff size:   regular  Vitals Entered By: Romualdo Bolk, CMA (AAMA) (Oct 17, 2009 10:33 AM) CC: Pt is here for a bp check- no complaints   Serial Vital Signs/Assessments:  Time      Position  BP       Pulse  Resp  Temp     By                     152/80   92                    Shannon S Cranford, CMA (AAMA)  Comments: Pt's machine By: Romualdo Bolk, CMA (AAMA)   reviewed BP readings from home and they are 124/65 p 80 130/70 p 79 and 133/73 93 and 125/73 pulse 100 from this month.   Tell patient  t have ROV in 6 months.    WKPanosh.  Allergies: 1)  ! Ibuprofen 2)  ! Hydrocodone  Appended Document: bp check/ssc Pt aware of this.

## 2010-07-03 NOTE — Miscellaneous (Signed)
Summary: BONE DENSITY  Clinical Lists Changes  Orders: Added new Test order of T-Bone Densitometry (77080) - Signed Added new Test order of T-Lumbar Vertebral Assessment (77082) - Signed 

## 2010-07-03 NOTE — Progress Notes (Signed)
Summary: received letter/has questions regarding dates  Phone Note Call from Patient Call back at Home Phone [phone]   Caller: Patient Reason for Call: Talk to Nurse Summary of Call: Patient received aletter stating she has appointments for January 12 and March 2.  Are these phone checks?  She has not received her new machine.  Initial call taken by: Burnard Leigh,  June 06, 2009 8:08 AM  Follow-up for Phone Call        Spoke with patient and explained she is due to see [name] in April and at that visit we will set her up for remote checks.  Patient is in agreement and will call to schedule the April visit. Follow-up by: Altha Harm, LPN,  June 06, 2009 8:58 AM

## 2010-07-03 NOTE — Procedures (Signed)
Summary: [email or web address] [email or web address]   Current Medications (verified): 1)  Aspir-81 81 Mg Tbec (Aspirin) .... 4 Tablets Per Week 2)  Carvedilol 12.5 Mg Tabs (Carvedilol) .... Take One Tablet By Mouth Twice A Day 3)  Isosorbide Mononitrate Cr 30 Mg Tb24 (Isosorbide Mononitrate) .... Take 1/2 Tablet By Mouth Once A Day 4)  Lipitor 80 Mg Tabs (Atorvastatin Calcium) .Marland Kitchen.. 1 Tab By Mouth Once Daily 5)  Centrum Silver   Tabs (Multiple Vitamins-Minerals) .Marland Kitchen.. 1 By Mouth Once Daily 6)  Alprazolam 0.25 Mg Tbdp (Alprazolam) .... 1/2 Qam and 1 At Bedtime or As Directed 7)  Vitamin D 1000 Unit Tabs (Cholecalciferol) 8)  Tylenol Pm Extra Strength 500-25 Mg Tabs (Diphenhydramine-Apap (Sleep)) 9)  Furosemide 20 Mg Tabs (Furosemide) .Marland Kitchen.. 1 Tab Once Daily 10)  Icaps  Caps (Multiple Vitamins-Minerals)  Allergies (verified): 1)  ! Ibuprofen 2)  ! Hydrocodone  PPM Specifications Following MD:  Sherryl Manges, MD     PPM Vendor:  [employer]     PPM Model Number:  [id number]     PPM Serial Number:  [id number] PPM DOI:  09/14/2008     PPM Implanting MD:  Sherryl Manges, MD  Lead 1    Location: RA     DOI: 04/18/1995     Model #: [id number]     Serial #: [id number]     Status: active Lead 2    Location: RV     DOI: 04/18/1995     Model #: [id number]     Serial #: [id number]     Status: active Lead 3    Location: Adapter     DOI: 09/14/2008     Model #: [id number]     Serial #: [id number]     Status: active  Magnet Response Rate:  BOL 100 ERI  85  Indications:  Sick sinus syndrome  Pacemaker dependent  Explantation Comments:  09/14/2008 [employer] [id number] explanted  PPM Follow Up Remote Check?  No Battery Voltage:  2.88 V     Battery Est. Longevity:  4.8 years     Pacer Dependent:  Yes       PPM Device Measurements Atrium  Amplitude: 1.2 mV, Impedance: 330 ohms, Threshold: 1.5 V at 0.8 msec Right Ventricle  Amplitude: 7.2 mV, Impedance: 590 ohms, Threshold: 0.75 V at 0.6 msec  Episodes MS Episodes:  3     Percent Mode Switch:  <1%      Coumadin:  No Atrial Pacing:  <1%     Ventricular Pacing:  100%  Parameters Mode:  DDD     Lower Rate Limit:  60     Upper Rate Limit:  130 Paced AV Delay:  200     Sensed AV Delay:  150 Next Cardiology Appt Due:  06/03/2010 Tech Comments:  No parameter changes.  The longest mode switch was 6 seconds, - coumadiin.  ROV 6 months with [name]. Altha Harm, LPN  December 08, 3242 10:24 AM

## 2010-07-03 NOTE — Assessment & Plan Note (Signed)
Summary: flu shot/njr  Nurse Visit   Review of Systems       Flu Vaccine Consent Questions     Do you have a history of severe allergic reactions to this vaccine? no    Any prior history of allergic reactions to egg and/or gelatin? no    Do you have a sensitivity to the preservative Thimersol? no    Do you have a past history of Guillan-Barre Syndrome? no    Do you currently have an acute febrile illness? no    Have you ever had a severe reaction to latex? no    Vaccine information given and explained to patient? yes    Are you currently pregnant? no    Lot Number:[id number]   Exp Date:12/01/2010   Site Given  Left Deltoid IM    Allergies: 1)  ! Ibuprofen 2)  ! Hydrocodone  Immunizations Administered:  Influenza Vaccine # 1:    Vaccine Type: Fluvax MCR    Site: left deltoid    Mfr: GlaxoSmithKline    Dose: 0.5 ml    Route: IM    Given by: Kern Reap CMA (AAMA)    Exp. Date: 12/01/2010    Lot #: [id number]    VIS given: 12/26/09 version given February 28, 2010.  Orders Added: 1)  Admin 1st Vaccine [90471] 2)  Flu Vaccine 41yrs + [90658] 3)  Flu Vaccine 6-35 months [90657] 4)  Admin of Any Addtl Vaccine [90472] 5)  Influenza Vaccine MCR [00025]

## 2010-07-03 NOTE — Progress Notes (Signed)
Summary: calling with question about flying  Phone Note Call from Patient Call back at Home Phone [phone]   Caller: Patient Summary of Call: Pt calling with question about taking a trip and flying Initial call taken by: Judie Grieve,  December 01, 2009 8:43 AM  Follow-up for Phone Call        Spoke with patient, she is concerned about taking trip to [place] , appt made for 7-7 to verify normal device function per pt request. Gypsy Balsam RN BSN  December 01, 2009 4:35 PM

## 2010-07-03 NOTE — Cardiovascular Report (Signed)
Summary: Office Visit   Office Visit   Imported By: Roderic Ovens 12/27/2009 16:16:05  _____________________________________________________________________  External Attachment:    Type:   Image     Comment:   External Document

## 2010-07-03 NOTE — Assessment & Plan Note (Signed)
Summary: ROV (PT WILL COME IN FASTING) (PELVIC) / RS   Vital Signs:  Patient profile:   77 year old female Menstrual status:  hysterectomy Height:      62.25 inches Weight:      147 pounds BMI:     26.77 Pulse rate:   60 / minute BP sitting:   140 / 90  (left arm) Cuff size:   regular  Vitals Entered By: Romualdo Bolk, CMA Duncan Dull) (September 29, 2009 8:39 AM)  Serial Vital Signs/Assessments:  Time      Position  BP       Pulse  Resp  Temp     By                     152/82   88                    Madelin Headings MD  Comments: right arm sitting By: Madelin Headings MD   CC: Annual visit for disease management- Pt is fasting for labs   History of Present Illness: [name] [name] comesin comes in today   fo r.fu of multiple medical problems  Since last visit for her knee problem she had  knee surgery from Dr Sherlean Foot and doing better .  Has seen cardiology recently and check is ok . CAD stable and no se of meds .Pacer check good  THere is stress at home   husband   leagally blind   and sometimes verbally takes it out on her. No falls  cookd 3 meals per day.  SOmetimes feels like heart beating out of her chest . Take  xanax two times a day low dose ocass higher . Sleep ok . LIPIDs: no se of meds         Preventive Care Screening  Mammogram:    Date:  06/04/2007    Results:  normal   Last Pneumovax:    Date:  03/03/2002    Results:  given    Contraindications/Deferment of Procedures/Staging:    Test/Procedure: Colonoscopy    Reason for deferment: patient declined     Test/Procedure: PAP Smear    Reason for deferment: hysterectomy   Preventive Screening-Counseling & Management  Alcohol-Tobacco     Alcohol drinks/day: 0     Smoking Status: never  Caffeine-Diet-Exercise     Caffeine use/day: 1     Does Patient Exercise: yes  Hep-HIV-STD-Contraception     Dental Visit-last 6 months yes     Sun Exposure-Excessive: no  Safety-Violence-Falls     Seat Belt  Use: yes     Firearms in the Home: firearms in the home     Firearm Counseling: not indicated; uses recommended firearm safety measures     Smoke Detectors: yes     Fall Risk: no falls      Fall Risk Counseling: disc   Current Medications (verified): 1)  Aspir-81 81 Mg Tbec (Aspirin) .... 4 Tablets Per Week 2)  Carvedilol 12.5 Mg Tabs (Carvedilol) .... Take One Tablet By Mouth Twice A Day 3)  Isosorbide Mononitrate Cr 30 Mg Tb24 (Isosorbide Mononitrate) .... Take 1/2 Tablet By Mouth Once A Day 4)  Lipitor 80 Mg Tabs (Atorvastatin Calcium) .Marland Kitchen.. 1 Tab By Mouth Once Daily 5)  Centrum Silver   Tabs (Multiple Vitamins-Minerals) .Marland Kitchen.. 1 By Mouth Once Daily 6)  Alprazolam 0.25 Mg Tbdp (Alprazolam) .... 1/2 Qam and 1 At Bedtime or As Directed 7)  Vitamin  D 1000 Unit Tabs (Cholecalciferol) 8)  Tylenol Pm Extra Strength 500-25 Mg Tabs (Diphenhydramine-Apap (Sleep)) 9)  Furosemide 20 Mg Tabs (Furosemide) .Marland Kitchen.. 1 Tab Once Daily 10)  Icaps  Caps (Multiple Vitamins-Minerals)  Allergies (verified): 1)  ! Ibuprofen 2)  ! Hydrocodone  Past History:  Past medical, surgical, family and social histories (including risk factors) reviewed, and no changes noted (except as noted below).  Past Medical History: Coronary artery disease LV Dysfunction intercurrent normalization of LV function cath 2007 Moderate-severe Mitral regurgitation Syncope Complete Heart Block  Pacemaker-BiV (S/P) Carotid artery disease--s/p endarterectomy Dyslipidemia Hypertension Osteoarthritis Decreased hearing  Hyperkalemia  Macular degeneration--impaired vision Decreased hearing left ear G4P4 Anxiety : on  two times a day xanax since 2007 Consults [name]  Past Surgical History: Coronary artery bypass graft x4 95 Hysterectomy Permanent pacemaker95  replaced  April 2010 Carotid endarterectomy95 Right  knee surgery  Dr Sherlean Foot.  Dec 2010   Past History:  Care Management: Cardiology: [name]  Orthopedics:  [place]-in the past,[facility]  Family History: Reviewed history from 06/21/2008 and no changes required. daughter died of melanoma Father: grangerene in one leg Mother: cerebral hemorrage Siblings: no health problems   Social History: Reviewed history from 04/24/2009 and no changes required. Married    hhof 2  Tobacco Use - No.  Alcohol Use - yes husband had aneurysm surgery in FEbruary  2010 is legally blind caretaker for him   Seat Belt Use:  yes Dental Care w/in 6 mos.:  yes Sun Exposure-Excessive:  no Fall Risk:  no falls   Review of Systems  The patient denies anorexia, fever, weight loss, weight gain, hoarseness, chest pain, syncope, dyspnea on exertion, peripheral edema, prolonged cough, headaches, hemoptysis, abdominal pain, melena, hematochezia, severe indigestion/heartburn, hematuria, incontinence, genital sores, muscle weakness, suspicious skin lesions, transient blindness, difficulty walking, depression, unusual weight change, abnormal bleeding, enlarged lymph nodes, angioedema, and breast masses.         some decrease hearing no change no balance problem  Physical Exam  General:  Well-developed,well-nourished,in no acute distress; alert,appropriate and cooperative throughout examination slightly hard of hearing  Head:  normocephalic, atraumatic, and no abnormalities observed.   Eyes:  vision grossly intact.  eoms nl  Ears:  R ear normal, L ear normal, and no external deformities.   Nose:  no external erythema and no nasal discharge.   Mouth:  good dentition and pharynx pink and moist.   Neck:  No deformities, masses, or tenderness noted. Chest Wall:  No deformities, masses, or tenderness noted. well healed midline scar  Breasts:  No mass, nodules, thickening, tenderness, bulging, retraction, inflamation, nipple discharge or skin changes noted.   Lungs:  Normal respiratory effort, chest expands symmetrically. Lungs are clear to auscultation, no crackles or  wheezes. Heart:  Normal rate and regular rhythm. S1 and S2 normal without gallop, no lifts.   Abdomen:  Bowel sounds positive,abdomen soft and non-tender without masses, organomegaly or hernias noted. Rectal:  No external abnormalities noted. Normal sphincter tone. No rectal masses or tenderness.  some hemorrhoids  Genitalia:  Pelvic Exam:        External: normal female genitalia without lesions or masses        Vagina: normal without lesions or masses        Cervix: absent        Adnexa: normal bimanual exam without masses or fullness        Uterus: absent         Pap smear:  not performed not indicated  Msk:  no joint warmth and no redness over joints.  helaed arthro scars right knee  Pulses:  pulses intact without delay   Extremities:  1+ left pedal edema and 1+ right pedal edema.   Neurologic:  alert & oriented X3 and strength normal in all extremities.      Pt is A&Ox3,affect,speech,memory,attention,&motor skills appear intact.  Skin:  turgor normal, color normal, no ecchymoses, no petechiae, and no purpura.   Cervical Nodes:  No lymphadenopathy noted Axillary Nodes:  No palpable lymphadenopathy Inguinal Nodes:  No significant adenopathy Psych:  Oriented X3, normally interactive, good eye contact, not depressed appearing, and not agitated.  mildy subdued and anxious but fasting     Impression & Recommendations:  Problem # 1:  HYPERTENSION (ICD-401.9) up today but  was told to hold meds for blood tests.     will monitor and  follow up if elevated    r/o   metabolic  thyroid etc.  Her updated medication list for this problem includes:    Carvedilol 12.5 Mg Tabs (Carvedilol) .Marland Kitchen... Take one tablet by mouth twice a day    Furosemide 20 Mg Tabs (Furosemide) .Marland Kitchen... 1 tab once daily  Orders: TLB-BMP (Basic Metabolic Panel-BMET) (80048-METABOL) Venipuncture (11914)  Problem # 2:  HYPERLIPIDEMIA (ICD-272.4) Insetting of stable CAD  Her updated medication list for this problem  includes:    Lipitor 80 Mg Tabs (Atorvastatin calcium) .Marland Kitchen... 1 tab by mouth once daily  Orders: Venipuncture (78295)  Problem # 3:  CORONARY ARTERY BYPASS GRAFT, HX OF (ICD-V45.81)  Her updated medication list for this problem includes:    Aspir-81 81 Mg Tbec (Aspirin) .Marland KitchenMarland KitchenMarland KitchenMarland Kitchen 4 tablets per week    Carvedilol 12.5 Mg Tabs (Carvedilol) .Marland Kitchen... Take one tablet by mouth twice a day    Isosorbide Mononitrate Cr 30 Mg Tb24 (Isosorbide mononitrate) .Marland Kitchen... Take 1/2 tablet by mouth once a day    Furosemide 20 Mg Tabs (Furosemide) .Marland Kitchen... 1 tab once daily  Orders: Venipuncture (62130)  Problem # 4:  ADJUSTMENT DISORDER WITH ANXIETY (ICD-309.24) Assessment: Unchanged counseled. TO  continue   benzo with precaution  ... fall risk  discussed   Problem # 5:  OSTEOARTHRITIS (ICD-715.90) Assessment: Unchanged  Her updated medication list for this problem includes:    Aspir-81 81 Mg Tbec (Aspirin) .Marland KitchenMarland KitchenMarland KitchenMarland Kitchen 4 tablets per week  Orders: TLB-CBC Platelet - w/Differential (85025-CBCD)  Problem # 6:  Preventive Health Care (ICD-V70.0) doing remarkably well as far as motor functioning  with gait and balance     had dexa in past that was normal years ago  will recheck this  . is totally independent adls   Complete Medication List: 1)  Aspir-81 81 Mg Tbec (Aspirin) .... 4 tablets per week 2)  Carvedilol 12.5 Mg Tabs (Carvedilol) .... Take one tablet by mouth twice a day 3)  Isosorbide Mononitrate Cr 30 Mg Tb24 (Isosorbide mononitrate) .... Take 1/2 tablet by mouth once a day 4)  Lipitor 80 Mg Tabs (Atorvastatin calcium) .Marland Kitchen.. 1 tab by mouth once daily 5)  Centrum Silver Tabs (Multiple vitamins-minerals) .Marland Kitchen.. 1 by mouth once daily 6)  Alprazolam 0.25 Mg Tbdp (Alprazolam) .... 1/2 qam and 1 at bedtime or as directed 7)  Vitamin D 1000 Unit Tabs (Cholecalciferol) 8)  Tylenol Pm Extra Strength 500-25 Mg Tabs (Diphenhydramine-apap (sleep)) 9)  Furosemide 20 Mg Tabs (Furosemide) .Marland Kitchen.. 1 tab once daily 10)  Icaps  Caps (Multiple vitamins-minerals)  Other Orders: TLB-TSH (Thyroid Stimulating Hormone) (  16109-UEA) TLB-Hepatic/Liver Function Pnl (80076-HEPATIC) TLB-Lipid Panel (80061-LIPID) TLB-T3, Free (Triiodothyronine) (84481-T3FREE) TLB-T4 (Thyrox), Free [phone]) Pelvic & Breast Exam ( Medicare)  (G0101) TD Toxoids IM 7 YR + (47829) Admin 1st Vaccine (56213)  Patient Instructions: 1)  schedule a bone density test ( dexa) 2)  Check blood pressure readings  next week and if elevated   3)  call and get a nurse visit  to check your blood pressure machine.  4)  You will be informed of lab results when available.  5)  Will plan follow up   after labs back .   Eye Exam  Last Eye done 05/03/2009 done by Dr. Hazle Quant it was normal exam. Romualdo Bolk, CMA (AAMA)  September 29, 2009 8:48 AM    Immunizations Administered:  Tetanus Vaccine:    Vaccine Type: Td    Site: right deltoid    Mfr: Sanofi Pasteur    Dose: 0.5 ml    Route: IM    Given by: Romualdo Bolk, CMA (AAMA)    Exp. Date: 06/16/2011    Lot #: [id number]

## 2010-07-03 NOTE — Progress Notes (Signed)
Summary: refill on alprazolam   Phone Note From Pharmacy   [facility]  [phone]* Reason for Call: Needs renewal Details for Reason: Alprazolam 0.25mg  Summary of Call: last filled on 12/19/09 #60 Initial call taken by: Romualdo Bolk, CMA (AAMA),  February 01, 2010 9:58 AM  Follow-up for Phone Call        ok x 2  Follow-up by: Madelin Headings MD,  February 02, 2010 10:02 AM  Additional Follow-up for Phone Call Additional follow up Details #1::        Rx faxed to pharmacy Additional Follow-up by: Romualdo Bolk, CMA (AAMA),  February 02, 2010 10:30 AM    Prescriptions: ALPRAZOLAM 0.25 MG TBDP (ALPRAZOLAM) 1/2 qam and 1 at bedtime or as directed  #60 x 1   Entered by:   Romualdo Bolk, CMA (AAMA)   Authorized by:   Madelin Headings MD   Signed by:   Romualdo Bolk, CMA (AAMA) on 02/02/2010   Method used:   Handwritten   RxID:   [id number]

## 2010-07-03 NOTE — Progress Notes (Signed)
Summary: refill on alprazolam 0.25mg   Phone Note From Pharmacy   [facility]  [phone]* Reason for Call: Needs renewal Details for Reason: alprazolam 0.25mg  Summary of Call: last filled on 08/28/09 # 60 Initial call taken by: Romualdo Bolk, CMA (AAMA),  Oct 09, 2009 3:34 PM  Follow-up for Phone Call        ok to refill x 2  Follow-up by: Madelin Headings MD,  Oct 09, 2009 7:38 PM  Additional Follow-up for Phone Call Additional follow up Details #1::        Rx sent back to pharmacy via fax. Additional Follow-up by: Romualdo Bolk, CMA (AAMA),  Oct 10, 2009 9:11 AM    Prescriptions: ALPRAZOLAM 0.25 MG TBDP (ALPRAZOLAM) 1/2 qam and 1 at bedtime or as directed  #60 x 1   Entered by:   Romualdo Bolk, CMA (AAMA)   Authorized by:   Madelin Headings MD   Signed by:   Romualdo Bolk, CMA (AAMA) on 10/10/2009   Method used:   Handwritten   RxID:   [id number]

## 2010-07-03 NOTE — Progress Notes (Signed)
Summary: rx refill  Phone Note Refill Request Message from:  Patient on May 11, 2010 8:04 AM  Refills Requested: Medication #1:  LIPITOR 80 MG TABS 1 tab by mouth once daily pt would like to have gernic lipitor. [facility]# [phone] fax#[phone]   Method Requested: Fax to Local Pharmacy Initial call taken by: Roe Coombs,  May 11, 2010 8:04 AM    Prescriptions: LIPITOR 80 MG TABS (ATORVASTATIN CALCIUM) 1 tab by mouth once daily  #30 Tablet x 6   Entered by:   Jacquelin Hawking, CMA   Authorized by:   Nathen May, MD, [facility]   Signed by:   Jacquelin Hawking, CMA on 05/11/2010   Method used:   [facility]ron[facility] to        [facility]  [phone]* (retail)       [address] [place], [place]  [address]       Ph: [phone] or [phone]       Fax: [phone]   RxID:   [id number]

## 2010-07-03 NOTE — Progress Notes (Signed)
Summary: refill  Phone Note From Pharmacy   [facility] [facility] [facility]  [phone]* Reason for Call: Needs renewal Details for Reason: Isosorbide  Initial call taken by: Romualdo Bolk, CMA Duncan Dull),  November 20, 2009 10:42 AM  Follow-up for Phone Call        Rx sent to pharmacy. Follow-up by: Romualdo Bolk, CMA Duncan Dull),  November 20, 2009 10:42 AM    Prescriptions: ISOSORBIDE MONONITRATE CR 30 MG TB24 (ISOSORBIDE MONONITRATE) Take 1/2 tablet by mouth once a day  #30 Tablet x 5   Entered by:   Romualdo Bolk, CMA (AAMA)   Authorized by:   Madelin Headings MD   Signed by:   Romualdo Bolk, CMA (AAMA) on 11/20/2009   Method used:   [facility]ronically to        [facility]  [phone]* (retail)       [address] [place], [place]  [address]       Ph: [phone] or [phone]       Fax: [phone]   RxID:   [id number]

## 2010-07-03 NOTE — Assessment & Plan Note (Signed)
Summary: B/P CONCERNS, FATIGUE / NOT FEELING WELL // RS   Vital Signs:  Patient profile:   77 year old female Menstrual status:  hysterectomy Weight:      148 pounds Pulse rate:   66 / minute BP sitting:   120 / 60  (right arm) Cuff size:   regular  Vitals Entered By: Romualdo Bolk, CMA (AAMA) (November 28, 2009 2:21 PM) CC: Pt is here for anxiety and to see if pacemaker needs to be check before going to [place]., Hypertension Management   History of Present Illness: [name] [name] comes in today  for sda  because wants to be checked .   She had a stressful day related to family and trip coming up to see her 75 Yo sis. in [place].  After this she felt dizzy when bent over  yesterday and took motion pill twice    Slept it off and then took   and then tylenol pm.    Is better today.   No cp sob or palpitations vision change or falling . No  focal weakness or numbness.  No balance change s. Wants to make sure she  is ok to travel and  ? if Dr Graciela Husbands  office need to check her  .   Still takes xanax but didnt take an extra     Hypertension History:      She complains of palpitations, dyspnea with exertion, and syncope, but denies headache, chest pain, orthopnea, PND, peripheral edema, visual symptoms, neurologic problems, and side effects from treatment.  She notes no problems with any antihypertensive medication side effects.        Positive major cardiovascular risk factors include female age 9 years old or older, hyperlipidemia, and hypertension.  Negative major cardiovascular risk factors include non-tobacco-user status.        Positive history for target organ damage include ASHD (either angina/prior MI/prior CABG).     Preventive Screening-Counseling & Management  Alcohol-Tobacco     Alcohol drinks/day: 0     Smoking Status: never  Caffeine-Diet-Exercise     Caffeine use/day: 1     Does Patient Exercise: yes  Current Medications (verified): 1)  Aspir-81 81 Mg Tbec  (Aspirin) .... 4 Tablets Per Week 2)  Carvedilol 12.5 Mg Tabs (Carvedilol) .... Take One Tablet By Mouth Twice A Day 3)  Isosorbide Mononitrate Cr 30 Mg Tb24 (Isosorbide Mononitrate) .... Take 1/2 Tablet By Mouth Once A Day 4)  Lipitor 80 Mg Tabs (Atorvastatin Calcium) .Marland Kitchen.. 1 Tab By Mouth Once Daily 5)  Centrum Silver   Tabs (Multiple Vitamins-Minerals) .Marland Kitchen.. 1 By Mouth Once Daily 6)  Alprazolam 0.25 Mg Tbdp (Alprazolam) .... 1/2 Qam and 1 At Bedtime or As Directed 7)  Vitamin D 1000 Unit Tabs (Cholecalciferol) 8)  Tylenol Pm Extra Strength 500-25 Mg Tabs (Diphenhydramine-Apap (Sleep)) 9)  Furosemide 20 Mg Tabs (Furosemide) .Marland Kitchen.. 1 Tab Once Daily 10)  Icaps  Caps (Multiple Vitamins-Minerals)  Allergies (verified): 1)  ! Ibuprofen 2)  ! Hydrocodone  Past History:  Past medical, surgical, family and social histories (including risk factors) reviewed, and no changes noted (except as noted below).  Past Medical History: Reviewed history from 09/29/2009 and no changes required. Coronary artery disease LV Dysfunction intercurrent normalization of LV function cath 2007 Moderate-severe Mitral regurgitation Syncope Complete Heart Block  Pacemaker-BiV (S/P) Carotid artery disease--s/p endarterectomy Dyslipidemia Hypertension Osteoarthritis Decreased hearing  Hyperkalemia  Macular degeneration--impaired vision Decreased hearing left ear G4P4 Anxiety : on  two  times a day xanax since 2007 Consults [name]  Past Surgical History: Reviewed history from 09/29/2009 and no changes required. Coronary artery bypass graft x4 95 Hysterectomy Permanent pacemaker95  replaced  April 2010 Carotid endarterectomy95 Right  knee surgery  Dr Sherlean Foot.  Dec 2010   Past History:  Care Management: Cardiology: [name]  Orthopedics: [place]-in the past,[place]  Family History: Reviewed history from 06/21/2008 and no changes required. daughter died of melanoma Father: grangerene in one  leg Mother: cerebral hemorrage Siblings: no health problems   Social History: Reviewed history from 09/29/2009 and no changes required. Married    hhof 2  Tobacco Use - No.  Alcohol Use - yes husband had aneurysm surgery in FEbruary  2010 is legally blind caretaker for him  other family stresses     Review of Systems  The patient denies anorexia, fever, weight loss, weight gain, vision loss, hoarseness, chest pain, syncope, peripheral edema, prolonged cough, abdominal pain, muscle weakness, transient blindness, difficulty walking, abnormal bleeding, enlarged lymph nodes, and angioedema.    Physical Exam  General:  alert, well-developed, well-nourished, and well-hydrated.  tearful at times  Head:  normocephalic and atraumatic.   Eyes:  vision grossly intact.   eoms nl  Ears:  R ear normal, L ear normal, and no external deformities.   Nose:  no external deformity and no nasal discharge.   Mouth:  pharynx pink and moist.   Neck:  No deformities, masses, or tenderness noted. bruit heard left  pulse presetn Lungs:  Normal respiratory effort, chest expands symmetrically. Lungs are clear to auscultation, no crackles or wheezes. Heart:  Normal rate and regular rhythm. S1 and S2 normal without gallop, no lifts.   Abdomen:  Bowel sounds positive,abdomen soft and non-tender without masses, organomegaly or  noted. Pulses:  nl Extremities:  1+ left pedal edema and 1+ right pedal edema.   Neurologic:  alert & oriented X3, strength normal in all extremities, and gait normal.  non focal exam  Skin:  turgor normal and color normal.   Cervical Nodes:  No lymphadenopathy noted Psych:  Oriented X3, good eye contact, and not anxious appearing.  tearfulat times but otherwise normal affect    Impression & Recommendations:  Problem # 1:  ORTHOSTATIC DIZZINESS (ICD-780.4) episode  when bending over and no syncope   .  ok now     nochange in exam      call if any recurrence or change   Problem # 2:   PACEMAKER, PERMANENT (ICD-V45.01) Assessment: Comment Only  Problem # 3:  OTHER ACUTE REACTIONS TO STRESS (ICD-308.3) counseled   Complete Medication List: 1)  Aspir-81 81 Mg Tbec (Aspirin) .... 4 tablets per week 2)  Carvedilol 12.5 Mg Tabs (Carvedilol) .... Take one tablet by mouth twice a day 3)  Isosorbide Mononitrate Cr 30 Mg Tb24 (Isosorbide mononitrate) .... Take 1/2 tablet by mouth once a day 4)  Lipitor 80 Mg Tabs (Atorvastatin calcium) .Marland Kitchen.. 1 tab by mouth once daily 5)  Centrum Silver Tabs (Multiple vitamins-minerals) .Marland Kitchen.. 1 by mouth once daily 6)  Alprazolam 0.25 Mg Tbdp (Alprazolam) .... 1/2 qam and 1 at bedtime or as directed 7)  Vitamin D 1000 Unit Tabs (Cholecalciferol) 8)  Tylenol Pm Extra Strength 500-25 Mg Tabs (Diphenhydramine-apap (sleep)) 9)  Furosemide 20 Mg Tabs (Furosemide) .Marland Kitchen.. 1 tab once daily 10)  Icaps Caps (Multiple vitamins-minerals)  Hypertension Assessment/Plan:      The patient's hypertensive risk group is category C: Target organ damage  and/or diabetes.  Today's blood pressure is 120/60.  Her blood pressure goal is < 140/90.  Patient Instructions: 1)  You can call Dr Marikay Alar  s  office and see if you need to be checked before your trip but I think this  was related  to anxiety and stress. 2)  Your BP and pulse are ok today . 3)  Ok to take an extra dose  of alprazolam if needed in the meantime. 4)  call if recurrent problems . 5)  I think you should go on your trip.

## 2010-07-03 NOTE — Progress Notes (Signed)
Summary: refill on alprazolam  Phone Note From Pharmacy   [facility]  [phone]* Reason for Call: Needs renewal Details for Reason: alprazolam 0.25mg  Summary of Call: last filled on 03/19/10 #60 Initial call taken by: Romualdo Bolk, CMA (AAMA),  April 30, 2010 10:57 AM  Follow-up for Phone Call        ok x 2  Follow-up by: Madelin Headings MD,  April 30, 2010 12:51 PM  Additional Follow-up for Phone Call Additional follow up Details #1::        rx faxed to pharmacy Additional Follow-up by: Romualdo Bolk, CMA (AAMA),  April 30, 2010 1:04 PM    Prescriptions: ALPRAZOLAM 0.25 MG TBDP (ALPRAZOLAM) 1/2 qam and 1 at bedtime or as directed  #60 x 1   Entered by:   Romualdo Bolk, CMA (AAMA)   Authorized by:   Madelin Headings MD   Signed by:   Romualdo Bolk, CMA (AAMA) on 04/30/2010   Method used:   Handwritten   RxID:   [id number]

## 2010-07-03 NOTE — Progress Notes (Signed)
Summary: Stomach pains on lipitor  Phone Note Call from Patient Call back at Home Phone [phone]   Caller: Patient Summary of Call: Pt states that the lipitor has caused stomach pains x 2 days. She takes it after breakfast. She is going to d/c it for now until md comes back and then we will let her know what Dr. Fabian Sharp thinks she needs to do going forward. Initial call taken by: Romualdo Bolk, CMA Duncan Dull),  April 20, 2010 10:49 AM  Follow-up for Phone Call        would like her to contact  Cardiology  officea bout this also   as they are the ones who are prescribint the med.  Also if contin ued abdominal pain   woud do follow up. If gets chest pain call cardiology or seek emergent care Follow-up by: Madelin Headings MD,  April 22, 2010 5:31 PM  Additional Follow-up for Phone Call Additional follow up Details #1::        LMTOCB Additional Follow-up by: Romualdo Bolk, CMA Duncan Dull),  April 23, 2010 9:38 AM    Additional Follow-up for Phone Call Additional follow up Details #2::    Pt aware and will call cardiology about this. Follow-up by: Romualdo Bolk, CMA (AAMA),  April 25, 2010 12:12 PM

## 2010-07-03 NOTE — Assessment & Plan Note (Signed)
Summary: [facility]   CC:  [facility] .  History of Present Illness: [name] [name] is seen in followup for ischemic heart disease with a [name] depression of LV function. She is status post bypass surgery in 2010 and catheterization in 2007. She is near normal left ventricular function. She has a history of complete heart block and is status post pacemaker implantation    She has been quite well without cardiovascular symptoms.The patient denies SOB, chest pain, edema or palpitations   Current Medications (verified): 1)  Aspir-81 81 Mg Tbec (Aspirin) .... 4 Tablets Per Week 2)  Carvedilol 12.5 Mg Tabs (Carvedilol) .... Take One Tablet By Mouth Twice A Day 3)  Isosorbide Mononitrate Cr 30 Mg Tb24 (Isosorbide Mononitrate) .... Take 1/2 Tablet By Mouth Once A Day 4)  Lipitor 80 Mg Tabs (Atorvastatin Calcium) .Marland Kitchen.. 1 Tab By Mouth Once Daily 5)  Centrum Silver   Tabs (Multiple Vitamins-Minerals) .Marland Kitchen.. 1 By Mouth Once Daily 6)  Alprazolam 0.25 Mg Tbdp (Alprazolam) .... 1/2 Qam and 1 At Bedtime or As Directed 7)  Vitamin D 1000 Unit Tabs (Cholecalciferol) 8)  Tylenol Pm Extra Strength 500-25 Mg Tabs (Diphenhydramine-Apap (Sleep)) 9)  Furosemide 20 Mg Tabs (Furosemide) .Marland Kitchen.. 1 Tab Once Daily  Allergies (verified): 1)  ! Ibuprofen 2)  ! Hydrocodone  Past History:  Past Medical History: Last updated: 01/31/2009 Coronary artery disease LV Dysfunction intercurrent normalization of LV function Moderate-severe Mitral regurgitation Syncope Complete Heart Block Pacemaker-BiV (S/P) Carotid artery disease--s/p endarterectomy Dyslipidemia Hypertension Osteoarthritis Decreased hearing  Hyperkalemia  Macular degeneration--impaired vision  Consults Dr. Graciela [name]  Past Surgical History: Last updated: 10/04/2008 Coronary artery bypass graft x4 95 Hysterectomy Permanent pacemaker95  replaced  April 2010 Carotid endarterectomy95  Family History: Last updated: 06/24/08 daughter died of  melanoma Father: grangerene in one leg Mother: cerebral hemorrage Siblings: no health problems   Social History: Last updated: 04/24/2009 Married   Tobacco Use - No.  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 HEENT Normal.  Neck veins were flat, carotids were brisk.  Lungs were clear.  Heart sounds were regular without murmurs or gallops.  Abdomen was soft with active bowel sounds. There is no clubbing cyanosis or edema. Skin Warm and dry    PPM Specifications Following MD:  [name] Manges, MD     PPM Vendor:  St Jude     PPM Model Number:  [id number] [id number]     PPM Serial Number:  [id number] PPM DOI:  09/14/2008     PPM Implanting MD:  [name] Manges, MD  Lead 1    Location: RA     DOI: 04/18/1995     Model #: [id number]     Serial #: [id number]     Status: active Lead 2    Location: RV     DOI: 04/18/1995     Model #: [id number]     Serial #: [id number]     Status: active  Lead 3    Location: Adapter     DOI: 09/14/2008     Model #: [id number]     Serial #: [id number]     Status: active  Magnet Response Rate:  BOL  MR=PR ERI  >  Indications:  Sick sinus syndrome  Pacemaker dependent  Explantation Comments:  09/14/2008 Paragon [id number] explanted  PPM Follow Up Remote Check?  No Battery Voltage:  2.98 V     Battery Est. Longevity:  5.1 years     Pacer Dependent:  Yes       PPM Device Measurements Atrium  Amplitude: 1.0 mV, Impedance: 330 ohms, Threshold: 1.5 V at 0.8 msec Right Ventricle  Amplitude: 8.5 mV, Impedance: 640 ohms, Threshold: 0.625 V at 0.6  msec  Episodes MS Episodes:  3     Coumadin:  No Atrial Pacing:  <1%     Ventricular Pacing:  100%  Parameters Mode:  DDD     Lower Rate Limit:  60     Upper Rate Limit:  130 Paced AV Delay:  200     Sensed AV Delay:  150 Next Cardiology Appt Due:  03/03/2010 Tech Comments:  No parameter changes.  Device function normal.  TTM's with [employer].  ROV 6 months clinic.  Checked by [employer]. [name] Harm, LPN  September 12, 2009 9:44 AM   Impression & Recommendations:  Problem # 1:  CORONARY ARTERY DISEASE (ICD-414.00)  stable   Her updated medication list for this problem includes:    Aspir-81 81 Mg Tbec (Aspirin) .Marland KitchenMarland KitchenMarland KitchenMarland Kitchen 4 tablets per week    Carvedilol 12.5 Mg Tabs (Carvedilol) .Marland Kitchen... Take one tablet by mouth twice a day    Isosorbide Mononitrate Cr 30 Mg Tb24 (Isosorbide mononitrate) .Marland Kitchen... Take 1/2 tablet by mouth once a day  Problem # 2:  PACEMAKER, PERMANENT (ICD-V45.01) Device parameters and data were reviewed and no changes were made  Problem # 3:  ATRIAL TACHYCARDIA (ICD-427.89) The patient has nonsustained atrial tachycardia. We will continue to follow  Her updated medication list for this problem includes:    Aspir-81 81 Mg Tbec (Aspirin) .Marland KitchenMarland KitchenMarland KitchenMarland Kitchen 4 tablets per week    Carvedilol 12.5 Mg Tabs (Carvedilol) .Marland Kitchen... Take one tablet by mouth twice a day    Isosorbide Mononitrate Cr 30 Mg Tb24 (Isosorbide mononitrate) .Marland Kitchen... Take 1/2 tablet by mouth once a day

## 2010-07-03 NOTE — Progress Notes (Signed)
Summary: refill on alprazolam  Phone Note From Pharmacy   Caller: [facility]  [address]  [phone]* Reason for Call: Needs renewal Details for Reason: alprazolam 0.25mg  Summary of Call: last filled on 11/19/09 #60 Initial call taken by: Romualdo Bolk, CMA (AAMA),  December 18, 2009 1:14 PM  Follow-up for Phone Call        ok to refill x 1  Follow-up by: Madelin Headings MD,  December 18, 2009 3:32 PM  Additional Follow-up for Phone Call Additional follow up Details #1::        Rx faxed to pharmacy. Additional Follow-up by: Romualdo Bolk, CMA (AAMA),  December 18, 2009 4:30 PM    Prescriptions: ALPRAZOLAM 0.25 MG TBDP (ALPRAZOLAM) 1/2 qam and 1 at bedtime or as directed  #60 x 0   Entered by:   Romualdo Bolk, CMA (AAMA)   Authorized by:   Madelin Headings MD   Signed by:   Romualdo Bolk, CMA (AAMA) on 12/18/2009   Method used:   Handwritten   RxID:   [id number]

## 2010-07-03 NOTE — Miscellaneous (Signed)
Summary: Orders Update  Clinical Lists Changes  Orders: Added new Test order of Carotid Duplex (Carotid Duplex) - Signed 

## 2010-07-05 NOTE — Progress Notes (Signed)
Summary: refill on alprazolam  Phone Note From Pharmacy   Caller: [facility]  [address]  [phone]* Reason for Call: Needs renewal Details for Reason: alprazolam 0.25mg  Summary of Call: last filled on 04/30/10 #60 Initial call taken by: Romualdo Bolk, CMA (AAMA),  May 29, 2010 8:30 AM  Follow-up for Phone Call        ok x 2  Follow-up by: Madelin Headings MD,  May 29, 2010 8:49 AM  Additional Follow-up for Phone Call Additional follow up Details #1::        Rx faxed to pharmacy. Additional Follow-up by: Romualdo Bolk, CMA (AAMA),  May 29, 2010 11:51 AM    Prescriptions: ALPRAZOLAM 0.25 MG TBDP (ALPRAZOLAM) 1/2 qam and 1 at bedtime or as directed  #60 x 1   Entered by:   Romualdo Bolk, CMA (AAMA)   Authorized by:   Madelin Headings MD   Signed by:   Romualdo Bolk, CMA (AAMA) on 05/29/2010   Method used:   Handwritten   RxID:   [id number]

## 2010-07-05 NOTE — Assessment & Plan Note (Signed)
Summary: pacer check/[facility] jude @ 2:1 ok per ronda   Visit Type:  Follow-up  CC:  no complaints.  History of Present Illness: [name] [name] is seen in followup for ischemic heart disease with a modest depression of LV function. She is status post bypass surgery in 2010 and catheterization in 2007. She is near normal left ventricular function. She has a history of complete heart block and is status post pacemaker implantation    She has been quite well without cardiovascular symptoms.The patient denies SOB, chest pain, edema or palpitations   Problems Prior to Update: 1)  Anemia, Mild  (ICD-285.9) 2)  Adj Disorder With Mixed Anxiety & Depressed Mood  (ICD-309.28) 3)  Other Acute Reactions To Stress  (ICD-308.3) 4)  Routine Gynecological Examination  (ICD-V72.31) 5)  Hyperlipidemia  (ICD-272.4) 6)  Atrial Tachycardia  (ICD-427.89) 7)  Aortic Valve Disorders  (ICD-424.1) 8)  Pacemaker, Permanent  (ICD-V45.01) 9)  Coronary Artery Bypass Graft, Hx of  (ICD-V45.81) 10)  Dermatitis, Allergic  (ICD-692.9) 11)  Adjustment Disorder With Anxiety  (ICD-309.24) 12)  Fatigue  (ICD-780.79) 13)  Constipation  (ICD-564.00) 14)  Abdominal Pain Right Lower Quadrant  (ICD-789.03) 15)  Leg Pain, Left,?radicular  (ICD-729.5) 16)  Carotid Endarterectomy, Hx of  (ICD-V15.1) 17)  Numbness, Arm  (ICD-782.0) 18)  Symptom, Pain, Abdominal, Unspecified Site  (ICD-789.00) 19)  Osteoarthritis  (ICD-715.90) 20)  Hypertension  (ICD-401.9) 21)  Coronary Artery Disease  (ICD-414.00)  Current Medications (verified): 1)  Aspir-81 81 Mg Tbec (Aspirin) .... 4 Tablets Per Week 2)  Carvedilol 12.5 Mg Tabs (Carvedilol) .... Take One Tablet By Mouth Twice A Day 3)  Isosorbide Mononitrate Cr 30 Mg Tb24 (Isosorbide Mononitrate) .... Take 1/2 Tablet By Mouth Once A Day 4)  Lipitor 80 Mg Tabs (Atorvastatin Calcium) .Marland Kitchen.. 1 Tab By Mouth Once Daily 5)  Centrum Silver   Tabs (Multiple Vitamins-Minerals) .Marland Kitchen.. 1 By Mouth Once  Daily 6)  Alprazolam 0.25 Mg Tbdp (Alprazolam) .... 1/2 Qam and 1 At Bedtime or As Directed 7)  Vitamin D 1000 Unit Tabs (Cholecalciferol) 8)  Tylenol Pm Extra Strength 500-25 Mg Tabs (Diphenhydramine-Apap (Sleep)) 9)  Furosemide 20 Mg Tabs (Furosemide) .Marland Kitchen.. 1 Tab Once Daily 10)  Icaps  Caps (Multiple Vitamins-Minerals)  Allergies (verified): 1)  ! Ibuprofen 2)  ! Hydrocodone  Past History:  Past Medical History: Last updated: 09/29/2009 Coronary artery disease LV Dysfunction intercurrent normalization of LV function cath 2007 Moderate-severe Mitral regurgitation Syncope Complete Heart Block  Pacemaker-BiV (S/P) Carotid artery disease--s/p endarterectomy Dyslipidemia Hypertension Osteoarthritis Decreased hearing  Hyperkalemia  Macular degeneration--impaired vision Decreased hearing left ear G4P4 Anxiety : on  two times a day xanax since 2007 Consults [name]  Past Surgical History: Last updated: 09/29/2009 Coronary artery bypass graft x4 95 Hysterectomy Permanent pacemaker95  replaced  April 2010 Carotid endarterectomy95 Right  knee surgery  Dr Sherlean Foot.  Dec 2010   Family History: Last updated: 07-03-08 daughter died of melanoma Father: grangerene in one leg Mother: cerebral hemorrage Siblings: no health problems   Social History: Last updated: 04/12/2010 Married    hhof 2  Tobacco Use - No.  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 HEENT Normal.  Neck veins were flat, carotids were brisk.  Lungs were clear.  Heart sounds were regular without murmurs or gallops.  Abdomen was soft with active bowel sounds. There is no clubbing cyanosis or edema. Skin Warm and dry    EKG  Procedure date:  06/05/2010  Findings:      p synchronous pacing  PPM Specifications Following MD:  Sherryl Manges, MD     PPM Vendor:  St Jude     PPM Model Number:  [id number]     PPM Serial Number:  [id number] PPM DOI:  09/14/2008     PPM Implanting MD:  Sherryl Manges, MD  Lead 1    Location: RA     DOI: 04/18/1995     Model #: [id number]     Serial #: [id number]     Status: active Lead 2    Location: RV     DOI: 04/18/1995     Model #: [id number]     Serial #: [id number]     Status: active Lead 3    Location: Adapter     DOI: 09/14/2008     Model #: 4080     Serial #: [id number]     Status: active  Magnet Response Rate:  BOL 100 ERI  85  Indications:  Sick sinus syndrome  Pacemaker dependent  Explantation Comments:  09/14/2008 Paragon [id number] explanted  PPM Follow Up Remote Check?  No Battery Voltage:  2.96 V     Battery Est. Longevity:  4.6 years     Pacer Dependent:  Yes       PPM Device Measurements Atrium  Amplitude: 3.1 mV, Impedance: 330 ohms, Threshold: 1.75 V at 0.8 msec Right Ventricle  Amplitude: 9.3 mV, Impedance: 550 ohms, Threshold: 0.875 V at 0.6 msec  Episodes MS Episodes:  2     Percent Mode Switch:  <1%     Coumadin:  No Atrial Pacing:  <1%     Ventricular Pacing:  100%  Parameters Mode:  DDD     Lower Rate Limit:  60     Upper Rate Limit:  130 Paced AV Delay:  200     Sensed AV Delay:  150 Next Remote Date:  09/06/2010     Next Cardiology Appt Due:  06/04/2011 Tech Comments:  Mode switch reprogrammed DDIR.  Device function normal.  [employer] transmissions every 3 months.  ROV 1 year with [name]. Altha Harm, LPN  June 05, 2010 2:38 PM   Impression & Recommendations:  Problem # 1:  ATRIAL TACHYCARDIA (ICD-427.89) quiet Her updated medication list for this problem includes:    Aspir-81 81 Mg Tbec (Aspirin) .Marland KitchenMarland KitchenMarland KitchenMarland Kitchen 4 tablets per week    Carvedilol 12.5 Mg Tabs (Carvedilol) .Marland Kitchen... Take one tablet by mouth twice a day    Isosorbide Mononitrate Cr 30 Mg Tb24 (Isosorbide mononitrate) .Marland Kitchen... Take 1/2 tablet by mouth once a day  Problem # 2:  PACEMAKER, PERMANENT (ICD-V45.01) Device parameters and data were reviewed and no changes were made  \  Problem # 3:  CORONARY ARTERY DISEASE (ICD-414.00) stable without chest pain on statins Her updated medication list for this problem includes:    Aspir-81 81 Mg Tbec (Aspirin) .Marland KitchenMarland KitchenMarland KitchenMarland Kitchen 4 tablets per week    Carvedilol 12.5 Mg Tabs (Carvedilol) .Marland Kitchen... Take one tablet by mouth twice a day    Isosorbide Mononitrate Cr 30 Mg Tb24 (Isosorbide mononitrate) .Marland Kitchen... Take 1/2 tablet by mouth once a day  Problem # 4:  AV BLOCK, COMPLETE (ICD-426.0) stable pst pacing Her updated medication list for this problem includes:    Aspir-81 81 Mg Tbec (Aspirin) .Marland KitchenMarland KitchenMarland KitchenMarland Kitchen 4 tablets per week    Carvedilol 12.5 Mg Tabs (Carvedilol) .Marland Kitchen... Take one tablet by mouth twice a day    Isosorbide Mononitrate Cr 30 Mg Tb24 (Isosorbide mononitrate) .Marland Kitchen... Take 1/2 tablet by mouth once a day  Patient Instructions: 1)  Your physician recommends that you continue on your current medications as directed. Please refer to the Current Medication list given to you today. 2)  Your physician wants you to follow-up in:   6 months. You will receive a reminder letter in the mail two months in advance. If you don't receive a letter, please call our office to schedule the follow-up appointment.

## 2010-07-05 NOTE — Cardiovascular Report (Signed)
Summary: Office Visit   Office Visit   Imported By: Roderic Ovens 06/06/2010 12:18:27  _____________________________________________________________________  External Attachment:    Type:   Image     Comment:   External Document

## 2010-07-05 NOTE — Miscellaneous (Signed)
Summary: Orders Update  Clinical Lists Changes  Orders: Added new Test order of Carotid Duplex (Carotid Duplex) - Signed 

## 2010-08-14 ENCOUNTER — Other Ambulatory Visit: Payer: Self-pay | Admitting: *Deleted

## 2010-08-14 DIAGNOSIS — D649 Anemia, unspecified: Secondary | ICD-10-CM

## 2010-08-14 NOTE — Telephone Encounter (Signed)
Centricity  review last nov was in november and was supposed to fu about her anemia   In  A few months and she is not on the schedule for this .Please refer to centriciy and arrange for her to do  ROV . Can refill med x 1 in the meantime

## 2010-08-14 NOTE — Telephone Encounter (Signed)
Last filled on 07/12/10 #60

## 2010-08-15 MED ORDER — ALPRAZOLAM 0.25 MG PO TABS: ORAL_TABLET | ORAL | Status: DC

## 2010-08-15 NOTE — Telephone Encounter (Signed)
Rx faxed to the pharmacy. Letter sent to pt to call and make a non fasting lab appt then rov

## 2010-08-23 ENCOUNTER — Other Ambulatory Visit (INDEPENDENT_AMBULATORY_CARE_PROVIDER_SITE_OTHER): Payer: Medicare Other | Admitting: Internal Medicine

## 2010-08-23 DIAGNOSIS — D649 Anemia, unspecified: Secondary | ICD-10-CM

## 2010-08-23 LAB — CBC WITH DIFFERENTIAL/PLATELET
Basophils Absolute: 0 10*3/uL (ref 0.0–0.1)
Basophils Relative: 0.5 % (ref 0.0–3.0)
Eosinophils Absolute: 0.2 10*3/uL (ref 0.0–0.7)
Eosinophils Relative: 2 % (ref 0.0–5.0)
HCT: 35 % — ABNORMAL LOW (ref 36.0–46.0)
Hemoglobin: 12.1 g/dL (ref 12.0–15.0)
Lymphs Abs: 3.2 10*3/uL (ref 0.7–4.0)
MCV: 93.3 fl (ref 78.0–100.0)
Monocytes Absolute: 0.6 10*3/uL (ref 0.1–1.0)
Monocytes Relative: 7.8 % (ref 3.0–12.0)
Neutro Abs: 3.6 10*3/uL (ref 1.4–7.7)
Platelets: 209 10*3/uL (ref 150.0–400.0)
RBC: 3.75 Mil/uL — ABNORMAL LOW (ref 3.87–5.11)

## 2010-08-23 LAB — IBC PANEL: Saturation Ratios: 28.2 % (ref 20.0–50.0)

## 2010-08-23 NOTE — Progress Notes (Signed)
I have to put a note here to close the encounter.

## 2010-08-29 ENCOUNTER — Encounter: Payer: Self-pay | Admitting: Internal Medicine

## 2010-08-31 ENCOUNTER — Ambulatory Visit (INDEPENDENT_AMBULATORY_CARE_PROVIDER_SITE_OTHER): Payer: Medicare Other | Admitting: Internal Medicine

## 2010-08-31 ENCOUNTER — Encounter: Payer: Self-pay | Admitting: Internal Medicine

## 2010-08-31 VITALS — BP 120/70 | HR 72 | Ht 63.0 in | Wt 149.0 lb

## 2010-08-31 DIAGNOSIS — F4322 Adjustment disorder with anxiety: Secondary | ICD-10-CM

## 2010-08-31 DIAGNOSIS — I2510 Atherosclerotic heart disease of native coronary artery without angina pectoris: Secondary | ICD-10-CM

## 2010-08-31 DIAGNOSIS — D649 Anemia, unspecified: Secondary | ICD-10-CM

## 2010-08-31 NOTE — Patient Instructions (Signed)
Continue  Vitamins  For now  Your anemia . Check up in 6 months

## 2010-08-31 NOTE — Progress Notes (Signed)
  Subjective:    Patient ID: [name] [name], female    DOB: 07/19/1920, 77 y.o.   MRN: [id number]  HPI Patient comes in today as directed for followup of her slight anemia. Since her last visit she has had no unusual bleeding falling or major changes in her own health status. She's been taking vitamins every day. She feels physically well but is very stressed  on any given day because her husband who is 85 years old has dementia and"thinks that he 67" and does things that are worrisome and may be dangerous at times.     She has had her pacer checked within the last few months and is on a retained schedule. No side effects of the medicine she's on. She is only taking Xanax twice a day at this point occasionally takes an extra half if needed.   Review of Systems No new chest pain shortness of breath wheezing swelling bleeding. Her vision is okay.  Past Medical History  Diagnosis Date  . CAD (coronary artery disease)   . LV dysfunction     intercurrent normalization of lv function- cath 2007  . Mitral regurgitation     moderate  . Syncope   . Heart block   . Pacemaker     BiV (s/p)  . Hyperlipidemia   . Osteoarthritis   . Decreased hearing     left ear  . Hyperkalemia   . Macular degeneration     impaired vision  . Anxiety      on med bid since 2007  . Carotid arterial disease     sp cae    Past Surgical History  Procedure Date  . Coronary angioplasty 2007  . Abdominal hysterectomy   . Pacemaker placement 1995 and replaced April 2010  . Coronary artery bypass graft 1995    x4  . Carotid endarterectomy 1995  . Rt knee surgery 05/2009    reports that she has never smoked. She does not have any smokeless tobacco history on file. She reports that she drinks alcohol. Her drug history not on file. family history includes Melanoma in her daughter and Other in her father and mother. Allergies  Allergen Reactions  . Hydrocodone   . Ibuprofen        Objective:   Physical  Exam Well-developed well-nourished who appears younger than her stated age mobility is good and facile. Psych Oriented x 3 and no noted deficits in memory, attention, and speech.  She is articulate monitor currently stressed but totally appropriate.         Assessment & Plan:    Anemia improved iron studies are normal we'll follow no alarm features..  Secondary anxiety and stress related to family situation and illness. We discussed and reviewed risk benefit of medication and she can continue at present she is not on a controller medicine. She is coping as best possible.  We'll have her come in for her 6 month wellness check with. Labs at that visit. Or whatever is appropriate at that time.   Total visit > 50% spent counseling and coordinating care

## 2010-09-02 ENCOUNTER — Encounter: Payer: Self-pay | Admitting: Internal Medicine

## 2010-09-06 ENCOUNTER — Encounter: Payer: Self-pay | Admitting: *Deleted

## 2010-09-11 ENCOUNTER — Encounter: Payer: Self-pay | Admitting: *Deleted

## 2010-09-12 ENCOUNTER — Telehealth: Payer: Self-pay | Admitting: Internal Medicine

## 2010-09-12 NOTE — Telephone Encounter (Signed)
Pt  States she has not receive a machine to be able to transmit from home.  Pt would like to speak with paula if possible.

## 2010-09-12 NOTE — Telephone Encounter (Signed)
Spoke with patient.  Transmitter not ordered at enrollment into [email or web address].  Verified patient address, ordered transmitter.  Patient aware to send transmission on receipt of equipment.

## 2010-09-17 ENCOUNTER — Telehealth: Payer: Self-pay | Admitting: *Deleted

## 2010-09-17 MED ORDER — ALPRAZOLAM 0.25 MG PO TABS: ORAL_TABLET | ORAL | Status: DC

## 2010-09-17 NOTE — Telephone Encounter (Signed)
Refill on alprazolam 0.25mg  last filled on 07/12/10 #60

## 2010-09-17 NOTE — Telephone Encounter (Signed)
Per dr. Fabian Sharp ok x 1. rx faxed to pharmacy

## 2010-10-08 ENCOUNTER — Other Ambulatory Visit: Payer: Self-pay | Admitting: Internal Medicine

## 2010-10-10 ENCOUNTER — Other Ambulatory Visit: Payer: Self-pay | Admitting: Internal Medicine

## 2010-10-10 DIAGNOSIS — Z1231 Encounter for screening mammogram for malignant neoplasm of breast: Secondary | ICD-10-CM

## 2010-10-16 NOTE — Assessment & Plan Note (Signed)
[facility]                         ELECTROPHYSIOLOGY OFFICE NOTE   NAME:Oler, SIRIAH TREAT                     MRN:          [id number]  DATE:07/06/2008                            DOB:          07-Oct-1920    Ms. Risdon is seen today in followup for pacemaker implanted and she is  approaching ERI.  She has complete heart block, ischemic heart disease,  and normal left ventricular function.  She continues to feel better on  her lower doses of medication.  She is actually on double beta-blockers  which will need to be clarified when she returns.  We have her currently  taking the Coreg 6.25 b.i.d. and metoprolol 12.5 daily, I am not sure if  that is correct.  In addition, she takes Lipitor 80, aspirin, isosorbide  7.5.   On examination, her blood pressure today was 118/64 with a pulse of 79.  The lungs were clear.  The heart sounds were regular with a 2/6 murmur  that was early peaking.  The extremities were without edema.   IMPRESSION:  1. Complete heart block.  2. Device dependence with a St. Jude device.   Ms. Chojnowski is stable.  We will plan to see her again in 8 weeks' time to  reassess her battery function.     Duke Salvia, MD, [facility]  Electronically Signed    SCK/MedQ  DD: 07/06/2008  DT: 07/06/2008  Job #: [id number]

## 2010-10-16 NOTE — Assessment & Plan Note (Signed)
 HEALTHC[facility]                         ELECTROPHYSIOLOGY OFFICE NOTE   NAME:Olvey, TAGAN                       MRN:          [id number]  DATE:01/29/2007                            DOB:          02/16/1921    Ms. Blecha is seen.  She is status post pacemaker implantation for  complete heart block.  She is device-dependent.  She is currently doing  quite well.  She has no complaints of chest pain or shortness of breath.  She just has problems with hypotension, which we noted when we saw her  in April, and Dr. Fabian Sharp has followed through on medical  recommendations, and Dr. Fabian Sharp has made some decisions regarding  medical therapy which is associated with improvement in her symptoms.   CURRENT MEDICATIONS:  1. Coreg 12.5/25.  2. Lisinopril 10 nightly.  3. Imdur is down to 15.  4. She is on Lipitor.   PHYSICAL EXAMINATION:  Her blood pressure is 116/76, pulse 78.  LUNGS:  Clear.  CARDIAC:  Heart sounds were regular.  EXTREMITIES:  Without edema.   Interrogation of her St. Jude Paragon pulse generator demonstrates a P  wave of 1.5 with an impedance of 386, a threshold of 1 volt at 0.8.  There is no RV in ventricular arrhythmia.  Impedance of 622, threshold  of 1 volt at 0.6.   Estimated longevity was 1.3 years.   IMPRESSION:  1. Complete heart block.  2. Status post pacemaker for the above.  3. Ischemic heart disease.      a.     Prior bypass grafting.      b.     Normal left ventricular function.  4. Hypotension, improved with dose reduction.  5. Dyslipidemia on Lipitor.   Ms. Baumbach is stable.  We will see her again in one year's time.     Duke Salvia, MD, [facility]  Electronically Signed    SCK/MedQ  DD: 01/29/2007  DT: 01/30/2007  Job #: [id number]

## 2010-10-16 NOTE — Assessment & Plan Note (Signed)
[facility]                         ELECTROPHYSIOLOGY OFFICE NOTE   NAME:Blau, TYRENA GOHR                     MRN:          [id number]  DATE:02/09/2008                            DOB:          24-Jan-1921    Ms. Hesser is seen in followup for a pacemaker implanted years and years  ago for complete heart block.  She also has ischemic heart disease with  normal left ventricular function.  Because she was feeling pretty crummy  earlier this summer, her medications were adjusted.  I do not have all  the details, but ultimately, her Coreg has gone from 25 twice a day to  3.125 twice a day and with this she has felt considerably better.  However, she and her husband have both noted that her heart rate is  faster with heart rates recorded a year ago in the 70s and now  persistently in the 95-105s.   She has noted no change in her sweating, diet, bowel pattern, or skin  pattern.   MEDICATIONS INCLUDE:  1. Coreg as noted.  2. Imdur at 7.5.  3. Lisinopril 10.  4. Furosemide 10.  5. Lipitor 80.   PHYSICAL EXAMINATION:  VITAL SIGNS:  Her blood pressure is 113/73 with a  pulse of 109.  LUNGS:  Clear.  NECK:  Veins were flat.  HEART:  Sounds were irregular, but regularly so with a 2/6 murmur.  EXTREMITIES:  Without edema.   Interrogation of her St. Jude Paragon [employer] demonstrates an  atrial impedance of 387, ventricular impedance of 487, battery voltage  2.56 with a cell impedance of 18 kilohms, and an estimated longevity of  6 months.   We undertook an electrocardiogram and compared it morphologically to an  electrocardiogram from a year ago.  The P-waves are very similar except  for the loss of the positive component in lead V1.   I should note also that she has a relatively prolonged paced AV delay at  about 250 milliseconds, RV function of 250 milliseconds as well as pacer  200.   IMPRESSION:  1. Complete heart block.  2. Status post  pacer after complete heart block approaching elective      replacement indicator.  3. Tachycardia, question mechanism.  4. Ischemic heart disease with;      a.     Prior bypass surgery.      b.     Normal left ventricular function.  5. Moderate mitral regurgitation/tricuspid regurgitation.   Ms. Momon has tachycardia, the cause of which I do not understand.  We  will plan to check hemoglobin and thyroid to see if this turns out to be  a secondary tachycardia, as it appears to be sinus.  There is some  possibility that it could be right atrial with sort of more anterior  location, given the loss of the R wave in V1, that might not be simply  driven by the higher rates.   We will have to see how it is if she does with the blood work as  mentioned.     Duke Salvia, MD, [facility]  Electronically Signed    SCK/MedQ  DD: 02/09/2008  DT: 02/10/2008  Job #: [id number]

## 2010-10-16 NOTE — Op Note (Signed)
NAMELARUEN, RISSER NO.:  [id number]   MEDICAL RECORD NO.:  [id number]          PATIENT TYPE:  OIB   LOCATION:  2899                         FACILITY:  [facility]   PHYSICIAN:  Duke Salvia, MD, FACCDATE OF BIRTH:  1920/07/14   DATE OF PROCEDURE:  09/14/2008  DATE OF DISCHARGE:  09/14/2008                               OPERATIVE REPORT   PREOPERATIVE DIAGNOSIS:  Complete heart block, previously implanted  pacemaker.   POSTOPERATIVE DIAGNOSIS:  Complete heart block, previously implanted  pacemaker.   PROCEDURES:  Explantation of her previously implanted device,  implantation of a new device with revision of the pocket for the  different size and shape.   Following obtaining informed consent, the patient was brought to the  electrophysiology laboratory and placed on the fluoroscopic table in  supine position.  After routine prep and drape and identification of the  access to the device, lidocaine was infiltrated overlying the previous  incision and carried down to layer of the device pocket using sharp  dissection and electrocautery.  The pocket was opened.  The device was  explanted.  The anchoring suture was removed.  I should note that device  was oriented in an in situ direction that the ports faced the axilla.  I  had trouble removing the atrial lead and so we ended up using a lead  removal tool.  The lead was 1188T, serial number [id number] and through  this lead, a P-wave of 2.1 was measured with impedance of 549 and a  threshold of 3 volts at 0.5.  The ventricular lead came up very easily,  was a 1246 THU lead, serial number [id number].  The R-wave was 12.1 with an  impedance of 539 and a threshold 0.9 at 0.5.  These leads were then  attached to an Accent DR pulse generator, serial number [id number], serial  number [id number].  P synchronous pacing was identified.  The leads and  pulse generator were placed back in the pocket and secured to the  prepectoral fascia.   The wound was copiously irrigated with antibiotic-  containing saline solution.  Hemostasis was assured and Surgicel was  then placed on the anterior cephalad and lateral aspect of the wound.  The wound was closed in 3 layers in normal fashion.  The wound was  washed and dried and a benzoin and Steri-Strip dressing was applied.  Needle counts, sponge counts, and instrument counts were correct at the  end of procedure according to staff.  The patient tolerated the  procedure without apparent complication.      Duke Salvia, MD, [facility]  Electronically Signed     SCK/MEDQ  D:  09/14/2008  T:  09/15/2008  Job:  [id number]

## 2010-10-16 NOTE — Discharge Summary (Signed)
[name] [name], [name] [name] NO.:  [id number]   MEDICAL RECORD NO.:  [id number]          PATIENT TYPE:  OIB   LOCATION:  2899                         FACILITY:  [facility]   PHYSICIAN:  Duke Salvia, MD, FACCDATE OF BIRTH:  05/18/1921   DATE OF ADMISSION:  09/14/2008  DATE OF DISCHARGE:  09/14/2008                               DISCHARGE SUMMARY   This patient has no known drug allergies.  Time for this dictation and  examination greater than 40 minutes.   FINAL DIAGNOSES:  1. Discharging day of explant existing St. Jude paragon dual-chamber      pacemaker.  2. Implant of a St. Jude Accent DR dual-chamber pacemaker with pocket      revision (hopefully this pacemaker has rate response feature.  3. Symptoms are fatigue.  4. Pacemaker interrogated and found to be at elective replacement      indicator.   SECONDARY DIAGNOSES:  1. Coronary artery disease, status coronary artery bypass graft      surgery.  2. Left heart catheterization December 2007, all grafts patent.      Ejection fraction normal.  3. Complaining of abdominal pain.  Liver function studies and amylase      and lipase obtained with preoperative labs this admission.   PROCEDURE:  September 14, 2008, explant of existing dual-chamber St. Jude  paragon device which is at elective replacement indicator with implant  of a St. Jude Accent DR dual-chamber pacemaker for a patient with  complete heart block, pacer dependence and device which is at elective  replacement indicator.   BRIEF HISTORY:  The patient is an 77 year old female.  She is seen in  followup for complete heart block.  She has a pacemaker implanted, the  pacemaker is now at elective replacement indicator.   The patient is complaining of fatigue. The device does not have a rate  responsive feature.  She also complains of a 37-month history of  intermittent abdominal pain.  This is not particular postprandial, it is  intermittent.  She thought it  might be I-caps that she has taken, these  have been discontinued without relief.   The patient's pacemaker leads have battery replacement.  We will plan to  undertake this at the earliest opportunity.  The risks and benefits have  been described to the patient.   As relates to nausea, we will get as part of our preoperative  laboratories, LFTs, lipase and amylase.  The patient will follow up with  Dr. Fabian Sharp if symptoms do not abate.  At discharge on April 14, the  patient is asked to keep her incision dry for the next 7 days, to sponge  and bathe until Wednesday April 21.  She may remove the bandage in the  morning of Thursday, April 15 and leave the incision open to the air.   MEDICATIONS:  Somewhat confused.  I will write it down as I see them.  1. Xanax 0.5 mg  1/2 tablet in the morning and 1 tablet in the      evening.  2. Enteric-coated aspirin 81 mg daily.  3.  Carvedilol 6.25 mg twice daily.  4. Metoprolol tartrate 25 mg 1/2 tablet in the morning (this could be      duplication which is unnecessary).  5. Isosorbide mononitrate 30 mg 1/2 tablet daily.  6. Lipitor 80 mg daily at bedtime.  7. Centrum silver daily.  8. I-caps daily.  9. Vitamin D 1000 units daily.  10.Tylenol Extra Strength at p.m./at bedtime.  Follow up with [facility] on 11/26.   LABORATORY:  As follows.  First of labs which applied to abdominal  complaint, lipase 33, the range is 11-59.  Liver panel alkaline  phosphatase 74, SGOT 24, SGPT is 15, total protein 6.8, albumin 3.7.  Serum electrolytes sodium 141, potassium 5.1, chloride 103, carbonate  32, glucose 83, BUN is 45, creatinine is 1, glucose 83, white cells 8.6,  hemoglobin 12.1, hematocrit 35.7 and platelets 233.      Maple Mirza, [place]      Duke Salvia, MD, [facility]  Electronically Signed    GM/MEDQ  D:  09/14/2008  T:  09/15/2008  Job:  [id number]   cc:   Neta Mends. Fabian Sharp, MD

## 2010-10-16 NOTE — Assessment & Plan Note (Signed)
[facility]                         ELECTROPHYSIOLOGY OFFICE NOTE   NAME:Apodaca, IREM STONEHAM                     MRN:          [id number]  DATE:09/06/2008                            DOB:          January 14, 1921    Ms. Arthurs was seen in followup for complete heart block status post  pacemaker implantation.  She is now in ERI.   She continues to complain of fatigue.  I should note that her device is  a none-rate responsive device.   She also complains of about a 88-month history of intermittent abdominal  pain.  This is not particularly postprandial, it has been intermittent.   She thought may be it is her ICaps, this has been discontinued without  relief.   MEDICATIONS:  Medications are unknown, because they are in the EMR and  the EMR is not available.   PHYSICAL EXAMINATION:  VITAL SIGNS:  Her blood pressure was 122/72, her  weight was 149, and her pulse was 64.  NECK:  Veins were 6.  LUNGS:  Clear.  HEART:  Sounds were regular without a discernible murmur.  ABDOMEN:  Soft without discomfort.  There was no hepatomegaly or midline  pulsation.  Femoral pulses were 2+.  EXTREMITIES:  There is no peripheral edema.   Interrogation of her St. Jude pulse generator demonstrates a P-wave of 1  with impedance of 463 and threshold of 1 volt at 0.8.  There is no  intrinsic ventricular rhythm with impedance of 594, threshold of 1 volt  at 0.6.  There was a Production designer, theatre/television/film.   IMPRESSION:  1. Complete heart block.  2. Status post pacer for the above, now at [facility].  3. Coronary artery disease with;      a.     Prior bypass.      b.     Modest depression of left ventricular systolic function.  4. Abdominal discomfort, question cause.  5. Fatigue.   Ms. Herringshaw pacemaker needs battery replacement.  We will plan to  undertake that.  I have reviewed with them the potential benefits as  well as potential risks including, but not limited to infection.   As it relates to her nausea, we will get, as part of her preoperative  laboratories, LFTs and amylase.  I have asked her to follow up with Dr.  Fabian Sharp in the event if these symptoms do not abate.   It may also be that she is chronotropically incompetent in the  restoration of rate response with her new pulse generator.  We will  ameliorate some of her symptoms.     Duke Salvia, MD, [facility]  Electronically Signed    SCK/MedQ  DD: 09/06/2008  DT: 09/06/2008  Job #: [id number]

## 2010-10-18 ENCOUNTER — Other Ambulatory Visit: Payer: Self-pay | Admitting: *Deleted

## 2010-10-18 ENCOUNTER — Ambulatory Visit
Admission: RE | Admit: 2010-10-18 | Discharge: 2010-10-18 | Disposition: A | Discharge status: Home / Self Care | Payer: Medicare Other | Source: Ambulatory Visit | Attending: Internal Medicine | Admitting: Internal Medicine

## 2010-10-18 DIAGNOSIS — Z1231 Encounter for screening mammogram for malignant neoplasm of breast: Secondary | ICD-10-CM

## 2010-10-18 MED ORDER — CARVEDILOL 12.5 MG PO TABS: 12.5000 mg | ORAL_TABLET | Freq: Two times a day (BID) | ORAL | Status: DC

## 2010-10-19 NOTE — Discharge Summary (Signed)
[place]  Patient:    [name] [name], [name] [name]                     MRN: [id number] Adm. Date:  [id number] Disc. Date: [id number] Attending:  Charlett Lango Dictator:   Maxwell Marion, RNFA CC:         Celso Sickle, M.D.  Jefry H. Pollyann Kennedy, M.D.  Bunnie Philips, M.D., [address], [place], [place]   Discharge Summary  DATE OF BIRTH:  11/11/2020  ADMISSION DIAGNOSIS:  Three-vessel coronary artery disease.  PAST MEDICAL HISTORY: 1. Coronary artery disease. 2. History of syncope and complete heart block, status post DDD permanent    pacemaker insertion in 1996. 3. Status post left carotid endarterectomy in 1995 by P. Bud Face, M.D. 4. Hypertension. 5. Elevated lipids. 6. Valvular disease.  She was noted to have mild mitral and tricuspid    regurgitation.  ALLERGIES:  Ms. Hancox has no known drug allergies, but she has been instructed to avoid ACE inhibitors after this admission.  DISCHARGE DIAGNOSES: 1. Three-vessel coronary artery disease, status post coronary artery bypass    grafting. 2. Postoperative supraglottic edema.  HOSPITAL COURSE AND PROCEDURES:  Ms. Kleier is a 77 year old female who was admitted to [facility] [place] C. Dorris Fetch, M.D., on June 06, 2000, for an elective coronary artery bypass grafting x 4. Grafts placed at the time of the procedure were left internal mammary artery to the proximal LAD, the saphenous vein was grafted to the distal LAD, the saphenous vein was grafted to the circumflex, and the saphenous vein was grafted to the distal right coronary artery.  The saphenous vein was harvested from bilateral lower extremities.  Intraoperative TEE was performed by Onalee Hua A. Crews, M.D., and it revealed 1+ mitral regurgitation and good LV function. At the conclusion of the procedure, she was transferred to the SICU in stable condition.  Her postoperative course is notable for supraglottic  edema with respiratory obstruction requiring ______.  Ms. Hassebrock initially did very well postoperatively and was extubated within several hours after surgery.  Shortly thereafter, she developed respiratory distress with symptoms of stridor.  An ENT consult was obtained with Jefry H. Pollyann Kennedy, M.D., confirmed the diagnosis of supraglottic edema and he suspects that these symptoms were related to administration of an ACE inhibitor taken the morning of surgery, coupled with minor trauma from minor trauma from surgical intubation and intraoperative TEE.  He recommended intubation until the swelling decreased.  She was re-evaluated on June 08, 2000.  She still had some edema and Jefry H. Pollyann Kennedy, M.D., recommended adding steroids to her regimen.  She was re-evaluated on June 09, 2000, and was noted to be able to be extubated.  She has had no further upper airway compromise.  The remainder of [name] postoperative course has been uneventful.  She has made very good progress towards recovery and it is anticipated by Viviann Spare C. Dorris Fetch, M.D., that she will be discharged home tomorrow, June 13, 2000.  CONDITION ON DISCHARGE:  [name] condition is much improved.  DISCHARGE INSTRUCTIONS:  She has been instructed regarding activity, diet, wound, medications, and follow-up.  DISCHARGE MEDICATIONS: 1. Enteric-coated aspirin 325 mg p.o. q.d. 2. Darvocet-N 100 one to two p.o. q.4-6h. p.r.n. 3. Lasix 40 mg p.o. q.d. x 10 days. 4. Potassium chloride 10 mEq p.o. q.d. x 10 days. 5. Norvasc 5 mg p.o. q.d.  She has also been instructed to  resume her home medications of Premarin 0.625 mg p.o. q.d., Xanax 0.125 mg p.o. q.d., Tenormin 75 mg p.o. q.d., and to take one multivitamin a day.  FOLLOW-UP:  Ms. Rovira has been instructed to call Dr. Viviann Spare C. Hendricksons office and arrange for an appointment in two weeks and have a chest x-ray taken at that time.  She has an appointment to see Salvatore Decent. Dorris Fetch, M.D., at the [facility] office on Wednesday, July 02, 2000, at 9:45 a.m. Ms. Wyke also plans to follow up with the cardiac rehabilitation program in [place], [place]. DD:  06/12/00 TD:  06/13/00 Job: [id number]

## 2010-10-19 NOTE — Assessment & Plan Note (Signed)
[facility]                         ELECTROPHYSIOLOGY OFFICE NOTE   NAME:[name] [name], [name] [name]                       MRN:          [id number]  DATE:09/12/2006                            DOB:          [birth year]    HISTORY OF PRESENT ILLNESS:  [name] [name] was seen.  She has problems with  dizziness in the morning.  She is having no chest pain.  She takes  meclizine for it.  __________ in the morning, she takes some kind of  pill for it and it resolves.   It occurs after breakfast.  Her breakfast is notable for orange juice,  Cheerios and a cup of coffee.  It does not happen after other meals.   CURRENT MEDICATIONS INCLUDE:  1. Lipitor 20 taken in the evening.  2. Lipitor 80 in the morning.  3. Carvedilol 25 b.i.d. in the morning.  4. Imdur 30 at lunch.  5. Furosemide 10 in the morning.  6. Aspirin 81 every other day with some problems with upset stomach.   EXAMINATION:  Her blood pressure is 127/73, pulse is 72, although she  takes her blood pressure on some of these days and it is in the high  90s.  Lungs were clear.  Heart sounds were regular.  The extremities  were without edema.   Electrocardiogram demonstrated P-synchronous pacing.   Interrogation of her Pacesetter pulse generator demonstrates that she  has about a year and a half to go.  Her P-wave is 1 with impedance of  472, a threshold of 1 volt at 0.8.  She was RV-dependent with a pacing  impedance of 623, a threshold of 0.5 at 0.6.   IMPRESSION:  1. Complete heart block.  2. Ischemic heart disease with prior CABG.  3. Dizziness.   [name] [name] is okay from a cardiac point of view.  I am concerned about  her a.m. symptoms and the documented hypotension, so we have changed her  medications as follows:   1. Carvedilol 12.5/25.  2. Stop the Lipitor.  3. Stop the furosemide.   She is to let us know in a couple of weeks how it is that she is doing.  I will see her again in six  months.     Duke Salvia, MD, [facility]  Electronically Signed    SCK/MedQ  DD: 09/12/2006  DT: 09/12/2006  Job #: [id number]

## 2010-10-19 NOTE — Consult Note (Signed)
[place]  Patient:    [name] [name], [name] [name]                     MRN: [id number] Proc. Date: 06/07/00 Adm. Date:  [id number] Attending:  Charlett Lango                          Consultation Report  REFERRING PHYSICIAN:  Dr. Laneta Simmers.  REASON FOR CONSULTATION:  Inability to extubate following coronary surgery.  HISTORY OF PRESENT ILLNESS:  This is a 77 year old lady with a history of coronary disease who underwent a basically uneventful coronary artery bypass graft surgery yesterday morning.  She was extubated last night, and then sat up and developed severe swelling in her throat and stridor requiring re-intubation.  She was found to have severe swelling of the left side of pharynx when she was reintubated.  She has a history of having taken Lisinopril for a couple of years.  She took her last dose on the morning of surgery.  She has never had any significant airway problems in the past, although she has had some occasional feeling of some swelling and a lump in her throat in the past.  The remainder of her pertinent history is outlined on the admission history and physical.  PHYSICAL EXAMINATION:  GENERAL:  She is an elderly lady, orally intubated, somewhat sedated and partially arousable.  NECK:  There is no swelling or tenderness and no palpable mass in the neck.  HEENT:  Oral cavity and pharynx are clear, and are suctioned of saliva and mucoid contents.  There are no palpable oral cavity or pharyngeal masses.  PROCEDURE NOTE:  A direct laryngoscopy was performed with a small duodolaryngoscope at the bedside.  Sedation was not necessary.  I was able to visualize the laryngeal structures nicely.  There was severe tissue edema, boggy type of pale edema, involving the supraglottic tissues, more so on the left side, but involving bilateral structures.  The vocal cords themselves were unremarkable.  There is no evidence of any bruising or any  infection.  IMPRESSION:  Acute onset of supraglottic edema with respiratory obstruction. I suspect this is related to the ACE inhibitor that she had taken the morning of the surgery.  Perhaps the combination of that and the minor trauma from the intubation triggered this episode.  I recommend keep the endotracheal tube in place, and I will reevaluate in 24 to 48 hours, and we will attempt extubation as soon as the swelling goes down.  Recommend she avoid any ACE inhibitors in the future.  I have discussed this with Dr. Laneta Simmers as well as the patients husband and daughter. DD:  06/07/00 TD:  06/07/00 Job: [id number]

## 2010-10-19 NOTE — Assessment & Plan Note (Signed)
[facility]                                 ON-CALL NOTE   NAME:[name] [name], [name] [name]                     MRN:          [id number]  DATE:02/19/2008                            DOB:          [birth year]    PRIMARY CARDIOLOGIST:  Duke Salvia, MD, [facility].   PRIMARY CARE PHYSICIAN:  Neta Mends. Panosh, MD   I received a phone call from [name]'s daughter, [name],  telling me that her mother's heart rate had increased.  Her husband took  [name]'s blood pressure.  It was found to be a 160/100 and her  heart rate was running around 130.  The patient had recently seen Dr.  Sherryl Manges on February 09, 2008, at which time, [name] did note  that she was having some elevated heart rates.  The patient's pacemaker  was interrogated at that time and adjusted.  The patient had been on  Coreg 25 mg twice a day and he decreased it to 3.125 mg twice a day  because the patient was feeling bad on the higher dose.   The patient has had no problems until this evening concerning her heart  rate, and she did notice a rapid heart rate and hypertension.  The  patient was advised by myself to take an additional dose of Coreg 1.25  mg tonight, and to have her blood pressure and heart rate checked in  approximately 2 hours.  The patient had already taken a dose of her  Coreg, her evening dose around 5 p.m. today.  The patient is  asymptomatic, but is concerned about her heart rate being elevated.   Of note, when she was seen by [name] in the office, he ordered some  lab work, a thyroid profile, and a CBC.  The thyroid-stimulating hormone  was 0.86, and the CBC revealed a hemoglobin of 12.4 and hematocrit of  36.9.   I spoke with the patient.  I spoke with the patient's daughter, and I  spoke with the patient's son-in-law to advise them concerning the  additional dose of the Coreg and to recheck her blood pressure.  I have  also advised that if her heart rate  remains elevated and she remains  hypertensive, they are to give [place] a call again for further instructions.  In the interim, the patient is to rest and to keep her activity down for  the next couple of hours, and will be rechecked by blood pressure and  heart rate by her son-in-law.   I have also left a message with the office to have them give her a call  on Monday, February 22, 2008, to have a follow-up appointment made with  [name] or his associates  concerning a recheck of the patient's blood pressure, heart rate, and  possible adjustment of Coreg dosing.      [name]. [name], NP  Electronically Signed      Rollene Rotunda, MD, [facility]  Electronically Signed   KML/MedQ  DD: 02/19/2008  DT: 02/20/2008  Job #: [id number]

## 2010-10-19 NOTE — Procedures (Signed)
[place]  Patient:    [name] [name], [name] [name]                     MRN: [id number] Proc. Date: 06/06/00 Adm. Date:  [id number] Attending:  Charlett Lango CC:         Anesthesia Department.   Procedure Report  PROCEDURE:  Intraoperative transesophageal echocardiography.  HISTORY:  The patient was brought to the operating room today by Dr. Andrey Spearman for coronary artery bypass grafting.  There was a questions of mitral regurgitation that had been noted historically, and it was decided to investigate this using TEE during her bypass procedure.  DESCRIPTION OF PROCEDURE:  After satisfactory induction of general anesthesia including endotracheal intubation, a [employer] omniplane TEE probe was passed through the oropharynx into the esophagus atraumatically.  The left ventricle was easily visualized and all four walls appeared to move adequately, although somewhat hypokinetically throughout.  There were no segmental wall motion abnormalities noted.  The mitral valve was examined and the leaflets were slightly thickened but appeared to approximate quite well.  There was 1+ mitral regurgitation noted with occasional areas of 2+ noted peripherally.  The left atrial appendage was examined and found to be free of clot or smoke. The aortic valve was then examined and there were three cusps that were not heavily calcified.  Closure of the cusps was adequate.  There was trace regurgitation on color-flow exam.  The tricuspid valve was examined and found to be normal.  The interatrial septum was examined and, using color-flow technique, was found to be free of of a patent foramen ovale.  The right ventricle appeared normal.  The patient was placed on cardiopulmonary bypass by Dr. Dorris Fetch.  It was decided, based on the above TEE, that repair of the mitral valve was not necessary.  It should be noted that the valve was also examined under a  stress condition, raising a leg, and there was no change.  At the completion of the coronary artery bypass grafting, the patient was slowly weaned from the bypass machine.  The left ventricle was examined once again under TTE and contractility seemed to be improved.  There was slight decreased filling of the ventricle, and this was improved with intravenous fluid replacement.  The patient was weaned adequately and her chest was closed by Dr. Dorris Fetch.  The TEE probe was removed and the patient was transported to the SICU in good condition. DD:  06/08/00 TD:  06/08/00 Job: [id number]

## 2010-10-19 NOTE — Cardiovascular Report (Signed)
NAMEASHELEY, HELLBERG NO.:  [id number]   MEDICAL RECORD NO.:  [id number]          PATIENT TYPE:  OIB   LOCATION:  1963                         FACILITY:  [facility]   PHYSICIAN:  Veverly Fells. Excell Seltzer, MD  DATE OF BIRTH:  10/07/1920   DATE OF PROCEDURE:  05/29/2006  DATE OF DISCHARGE:                            CARDIAC CATHETERIZATION   PROCEDURE:  Left heart catheterization, selective coronary angiography,  left ventricular angiography, saphenous vein graft angiography, LIMA  angiography.   INDICATIONS:  [name] is a delightful 77 year old woman with known  coronary artery disease.  She is status post prior multivessel bypass  surgery.  She had bypass back in January of 2002.  She has developed  progressive angina with exertion over the last several weeks.  She  presents today for cardiac catheterization for further evaluation.   DESCRIPTION OF PROCEDURE:  The risks and indications of the procedure  were explained in detail to the patient.  Informed consent was obtained.  The right groin was prepped, draped, and anesthetized with 1% lidocaine.  The right femoral artery was accessed with a 4-French sheath using the  modified Seldinger technique.  The native left and right coronary  arteries were imaged using a 4-French JL-4 and 4-French JR-4 catheter,  respectively.  The JR-4 catheter was then used to image the saphenous  vein graft to the obtuse marginal branch as well as the saphenous vein  graft to the LAD.  The JR-4 catheter was then used to image the left  subclavian artery.  I was unable to selectively engage the left internal  mammary artery with the JR-4 catheter.  Using an exchange length wire, a  left internal mammary catheter was inserted and was used to selectively  engage the LIMA.  Following LIMA angiography, 4-French multipurpose  catheter was used to image the saphenous vein graft to the distal right  coronary artery.  Following bypass graft  angiography, a 4-French angled  pigtail catheter was inserted into the left ventricle, and left  ventricular pressures were recorded.  A ventriculogram was done.  A  pullback across the aortic valve was performed.  At the conclusion of  the procedure, manual pressure was used for hemostasis.   FINDINGS:  Aortic pressure 127/51 with a mean of 83, left ventricular  pressure 129/9 with an end-diastolic pressure of 14.   CORONARY ANGIOGRAPHY:  Left main stem:  The left main stem is normal  through its proximal and midportion.  Distally, the left main stem has a  70% stenosis.  The left main stem bifurcates into the LAD and left  circumflex.   The LAD is a medium caliber vessel that has a 50% stenosis at the first  septal perforator.  The second perforator involves a large territory of  the septum.  There is a first diagonal branch that is small caliber  vessel with nonobstructive ostial stenosis.  The mid and distal LAD fill  via competitive flow from the bypass graft.   The left circumflex is a diffusely diseased vessel throughout its  proximal portion.  There is a long segment of  80 to 90% stenosis.  There  is competitive flow throughout a large obtuse marginal branch as well as  the AV groove circumflex from its midportion onward.   The right coronary artery has mild calcification.  It is tortuous  throughout its proximal portion.  The proximal vessel has a 30%  stenosis, the mid-vessel has a 75% stenosis.  Distally, the vessel has  nonobstructive disease.  The distal branches of the right coronary  artery fill via native and competitive graft flow.  Distally the vessel  bifurcates into a PDA and posterior AV segment that gives off two  posterolateral branches.   SAPHENOUS VEIN GRAFT ANGIOGRAPHY:  The saphenous vein graft to the mid-  LAD is widely patent.  There is no obstructive disease seen.  The mid-  LAD as well as large second diagonal branch fill via graft flow.  The  LAD  just prior to the diagonal branch has a very tight stenosis that is  seen from the graft injections.  This stenosis is proximal to the  diagonal branch.   The saphenous vein graft to the first obtuse marginal branch is widely  patent.  It supplies a large bifurcating marginal then the territory of  the first and second OM.  The left circumflex fills in a retrograde  fashion.  The vein graft is widely patent without any obstructive  disease.   The left subclavian artery is severely calcified but is widely patent.  There is no pressure gradient.  Selective injection of the left internal  mammary artery shows that the vessel was occluded in its midportion.  There are multiple small collaterals present but no significant flow  reaching the LAD territory.   The saphenous vein graft to the right coronary artery is widely patent.  It is tortuous in its proximal portion.  It is anastomosed to the distal  right coronary artery.  The posterior AV segment fills via graft flow.  The PDA fills via competitive flow from the graft and the native vessel,  but is not very well visualized.   LEFT VENTRICULOGRAPHY:  The left ventriculogram performed in the 30-  degree right anterior oblique projection demonstrates normal left  ventricular function.  There appears to be some degree of mitral  regurgitation, but it is difficult to assess secondary to ventricular  ectopy.   ASSESSMENT:  1. Three-vessel native coronary artery disease.  2. Status post coronary bypass surgery with three of four bypasses      patent.  3. Normal left ventricular function.   DISCUSSION:  The patient has widely patent saphenous vein grafts to all  three of her major epicardial coronary territories.  I do not see any  areas that warrant intervention.  Recommend continued medical therapy  with upward titration of her antianginal therapy as tolerated. Currently her hemodynamics are excellent, with systolic blood pressure  in  the 120s at the time of her catheterization as well as normal left  ventricular filling pressures.  The patient will follow up with Dr.  Graciela Husbands as an outpatient.      Veverly Fells. Excell Seltzer, MD  Electronically Signed     MDC/MEDQ  D:  05/29/2006  T:  05/29/2006  Job:  [id number]   cc:   Duke Salvia, MD, [facility]  Neta Mends. Fabian Sharp, MD

## 2010-10-19 NOTE — Assessment & Plan Note (Signed)
[facility]                         ELECTROPHYSIOLOGY OFFICE NOTE   NAME:Xiong, AUBURN                       MRN:          [id number]  DATE:05/22/2006                            DOB:          1921/04/25    [name] [name] is seen today with new onset exertional left arm  discomfort.  It happened at the [facility], lasted for about 5 or 10  minutes and then abated, that was over the weekend.  It happened again  day before yesterday on the treadmill.   MEDICATIONS:  1. Coreg 25 b.i.d.  2. Lisinopril 10 mg.  3. Furosemide.  4. Aspirin 81 mg.   PAST MEDICAL HISTORY:  Notable for coronary artery bypass surgery done  by Dr. Dorris Fetch with a LIMA to her LAD, a vein graft to her distal  LAD, A vein graft to her OM and vein graft to her PDA.  She also had a  carotid endarterectomy done.  Prior she has a history of complete heart  block with status post pacemaker implantation.  Last known ejection  fraction obtained about a year ago was 0.65.   EXAMINATION:  Her heart rate was 67.  LUNGS:  Clear.  HEART:  Sounds were regular.  EXTREMITIES:  Without edema, femoral pulses were 1+, distal pulses were  trace.  ELECTROCARDIOGRAM:  Dated today demonstrated AV pacing with a QRS  duration of 0.17.   IMPRESSION:  1. Crescendo angina.  2. Ischemic heart disease with:      a.     Prior bypass surgery x4.      b.     Negative Myoview a year ago.      c.     Mitral regurgitation.  3. Complete heart block status post pacemaker implantation with a [employer] device and dependant.  4. Status post carotid endarterectomy.   I am concerned about the new onset angina in the state of her previously  known heart disease and obviously she had severe coronary disease that  she had 2 grafts to her LAD.  Will plan to undertake catheterization, I have reviewed this with her  and her husband.  She would like to defer until after Christmas.  We  will begin Imdur in  the interim at 30 mg a day.  She is to continue her aspirin but we will increase it to 325 mg a day  between now and next week.     Duke Salvia, MD, [facility]  Electronically Signed    SCK/MedQ  DD: 05/22/2006  DT: 05/22/2006  Job #: [id number]   cc:   Neta Mends. Fabian Sharp, MD

## 2010-10-19 NOTE — Cardiovascular Report (Signed)
[place]  Patient:    [name] [name], [name] [name]                     MRN: [id number] Proc. Date: 06/04/00 Adm. Date:  [id number] Attending:  Lyn Records. Iii CC:         Tonette Bihari, M.D., [place], [place]  Bunnie Philips, M.D., [place], [place]  CVTS   Cardiac Catheterization  INDICATIONS FOR PROCEDURE:  Class III angina in a patient with known coronary artery disease.  PROCEDURES PERFORMED: 1. Left heart catheterization. 2. Selective coronary angiography. 3. Left ventriculography.  DESCRIPTION OF PROCEDURE:  After informed consent, a 6 French sheath was inserted into the right femoral artery using a modified Seldinger technique. A 6 French A2 multipurpose catheter was used for hemodynamic recordings, left ventriculography by power injection, and selective left and right coronary angiography.  The patient tolerated the procedure without complications.  RESULTS:  I:  HEMODYNAMIC DATA:     a. The aortic pressure was 142/74 mmHg.     b. Left ventricular pressure 141/20 mmHg.  II:  LEFT VENTRICULOGRAPHY:  The left ventricle demonstrates dyssynergia/asynergia secondary to pacemaker therapy.  Overall contractility, however, is felt to be preserved with an EF of greater than 50%.  There is 1, to no more than 2+ mitral regurgitation noted.  III:  SELECTIVE CORONARY ANGIOGRAPHY:  The left main, proximal LAD and proximal circumflex are heavily calcified.     a. Left main:  The left main appears to be widely patent with the        exception of a very distal left main, which appears to have        eccentric distal narrowing of up to 50% in the RAO views.  LAO        and cranial views do not demonstrate this.     b. Left anterior descending coronary artery:  The LAD is a large        vessel that gives origin in two large diagonal branches.  The        proximal LAD near the region of origin of the first diagonal contains        an eccentric lesion that  obstructs the vessel by 50-70%.  There is a        50% lesion in the mid vessel near the origin of the second septal        perforator.     c. Circumflex artery:  The circumflex artery is large.  It contains a        severe ostial somewhat segmental calcified stenosis that is graded to        be 90% that arises from the left main that extends into the first        4-6 mm of the proximal circumflex.  The mid circumflex also contains a        tortuous segment that contains a 50% lesion that is calcified.  The        circumflex bifurcates on the left lateral wall.     d. Right coronary artery:  The right coronary artery is a large vessel        that gives origin to a PDA and bifurcating left ventricular system.        The mid right coronary contains a segmental 60-70% stenosis.  The        distal right coronary before PDA contains a 50-60% narrowing.  The PDA  is large.  Three small left ventricular branches are noted to arise        distally.  CONCLUSIONS: 1. Severe ostial calcified obstruction in the circumflex with borderline    significant proximal left anterior descending, mild to moderate distal    left main, and borderline but probably significant mid right coronary    artery stenosis. 2. Overall normal left ventricular function. 3. Mild mitral regurgitation. 6. Dyssynergic left ventricular contraction pattern due to pacemaker therapy.  RECOMMENDATIONS:  Coronary artery bypass grafting. DD:  06/04/00 TD:  06/04/00 Job: [id number]

## 2010-10-19 NOTE — Assessment & Plan Note (Signed)
[facility]                                 ON-CALL NOTE   NAME:[name] [name], [name] [name]                     MRN:          [id number]  DATE:02/20/2008                            DOB:          Feb 18, 1921    ELECTRO PHYSIOLOGIST:  Duke Salvia, MD, [facility]   [name]' daughter, [name] contacted me because she stated  her mother was feeling bad and her blood pressure was elevated.  She  said her blood pressure was 150/110.  This was first thing in the  morning and she had not had her medications.  She also stated that her  heart rate was elevated.  She stated that she had been on Coreg 25 mg  b.i.d., but this was cut back to 3.125 mg b.i.d. because she felt  fatigued.  I requested that she give [name] 12.5 mg of Coreg as she  still had some 25 mg tablets she could cut in half.  I requested that  she give her all of her other morning blood pressure medications and  then contact me again in 2 hours.  In 2 hours blood pressure and heart  rate were well controlled and [name] was asymptomatic.  I requested  that she continue the Coreg at 25 mg one-half tab b.i.d. and [name]  stated she would.  I have put [name]' name on the list to be  contacted by the office on Monday for a follow-up appointment.  I  advised [name] that if [name] develop any further symptoms or  problems, she should call 911 and come directly to the emergency room.      Theodore Demark, PA-C  Electronically Signed      Pricilla Riffle, MD, [facility]  Electronically Signed   RB/MedQ  DD: 02/20/2008  DT: 02/20/2008  Job #: [id number]

## 2010-10-19 NOTE — Assessment & Plan Note (Signed)
[facility]                                 ON-CALL NOTE   NAME:[name] [name], [name] [name]                     MRN:          [id number]  DATE:02/21/2008                            DOB:          10-Oct-1920    TELEPHONE CONTACT NOTE.   ELECTROPHYSIOLOGIST:  Duke Salvia, MD, [facility]   [name] [name]'s daughter, [name] [name] contacted me on February 21, 2008, because her mother was a little symptomatic and her systolic blood  pressure was 89.  She had called yesterday because her blood pressure  was elevated, and her medications had been increased.  I reviewed the  medications with the daughter, we made the following changes.  I had  increased the Coreg from 3.125 b.i.d. to 12.5 b.i.d.  She had done this,  but since her blood pressure was low and since she takes lisinopril at  night, I requested that she change the Coreg dose to 12.5 mg in the  morning and 3.125 mg at night.  She had not yet taken her morning dose  of Lasix and she takes the lisinopril at night.  She is to hold both of  these doses today.  If her blood pressure is normal tomorrow, she is to  restart the medications as described with the change in the Coreg to  12.5 mg a.m. and 3.125 mg p.m.  She will be contacted by the office for  a followup appointment.  If she becomes symptomatic or has any further  issues, she is to call us immediately or call 911.  [name] [name] repeated  the instructions to me accurately and stated she was fine with the plan  of care.      Theodore Demark, PA-C  Electronically Signed      Marca Ancona, MD  Electronically Signed   RB/MedQ  DD: 02/22/2008  DT: 02/23/2008  Job #: [id number]

## 2010-10-19 NOTE — Consult Note (Signed)
[place]. [facility]  Patient:    [name] [name], [name] [name]                     MRN: [id number] Proc. Date: 06/04/00 Adm. Date:  [id number] Attending:  Charlett Lango CC:         Dr. Kathlyn Sacramento. Hanich, [facility], [address], [address], [place], [place]             Dr. Colin Broach. Burna Cash, [facility], [address], [address], [place], [place]             Dr. Verdis Prime   Consultation Report  REASON FOR CONSULTATION:  Three vessel coronary disease.  CHIEF COMPLAINT:  Angina.  HISTORY OF PRESENT ILLNESS: [name] [name] is a 77 year old female with a known history of coronary disease.  She had cardiac catheterization in both 1996 and 1998.  She had known osteal circumflex stenosis and a 50% mid right coronary blockage.  She has recently been having angina which she describes as ear discomforts into her teeth, jaw, and then into her left anterior chest down the shoulder and arm.  This usually occurs in the evenings between 7:00 and 9:00 p.m.  The episodes usually are very short lasting 30 seconds to a minute but sometimes are more prolonged.  She states that she has been having this nearly every night. She has not taken nitrates and she does not have a strong exertional component to this pain.  She states that prior to recently she had this happen every couple of weeks but then it was happening several times a week and now happens nearly every evening.  She underwent a stress cardiolite on November 1 which showed an ejection fraction of 36% with global hypokinesis.  There were posterolateral ischemic changes.  She now is admitted for cardiac catheterization.  Dr. Katrinka Blazing performed cardiac catheterization which revealed three vessel coronary disease.  She had approximately a 30-50% distal left main stenosis. She also had a greater than 50% mid LAD stenosis and 85% osteal circumflex stenosis.  She has both mid and diffuse right coronary disease.  Her  ejection fraction by cath was approximately 50% and she did have mitral regurgitation.  PAST MEDICAL HISTORY: Significant for coronary disease, see history of present illness.  She has a history of syncope status post placement of a DDD pace maker for complete heart block in 1996.  She had a left carotid endarterectomy by Dr. Madilyn Fireman in 1995.  She had a history of hypertension, hyperlipidemia, and valvular cardiac disease with mild mitral regurgitation and mild tricuspid regurgitation by previous echo.  PAST SURGICAL HISTORY: Significant for hysterectomy and appendectomy in 1972 in addition to above.  MEDICATIONS: 1. Norvasc 2.5 mg p.o. q d. 2. Atenolol 75 mg p.o. q d. 3. Zestril 5 mg p.o. q d. 4. Nitroglycerin patch. 5. Premarin 0.625 mg p.o. q d. 6. Ecotrin 325 mg p.o. q d. 7. Multivitamin 1 p.o. q d. 8. Alprazolam 0.125 mg p.o. q d.  ALLERGIES:  She does not have any drug or shell fish allergies.  FAMILY HISTORY: Significant for father who died at age 79.  Her mother died at age 102 of a cerebral hemorrhage.  She has two brothers who are deceased. The remaining siblings are in their 91s, all in good health, except for a brother who is in a nursing home.  SOCIAL HISTORY: She has been married for 56 years with four children.  She  had a light smoking history but quit in 1987.  She rarely uses ethanol.  REVIEW OF SYSTEMS: No recent syncope, presyncope, or dizzy spells.  No stroke or TIA.  She wears glasses.  She has diminished hearing in her right ear.  No dysphagia or reflux.  No change in bowel or bladder habits.  She does have occasional arthritic pains.  All other systems are negative.  PHYSICAL EXAMINATION:  GENERAL: She is a well appearing 77 year old white female in no acute distress.  VITAL SIGNS:  Blood pressure 132/70, pulse is 80 and regular, respirations 16.  NEUROLOGICAL:  She is alert and oriented x 3 and grossly intact motor and sensories function.  HEENT:   Within normal limits.  NECK:  Supple with no thyromegaly or adenopathy.  She has a well healed left carotid endarterectomy scar.  There is no bruits.  CHEST:  Clear to auscultation and percussion bilaterally.  CARDIAC:  Regular rate and rhythm with no rubs, murmurs, or gallops.  ABDOMEN:  Soft, nontender, nondistended.  EXTREMITIES:  Palpable dorsalis pedis pulses bilaterally.  There is no peripheral edema or cyanosis.  LABORATORY AND ACCESSORY DATA:  Her labs were reviewed.  Platelet count and coagulation studies within normal limits.  BUN and creatinine were 19 and 0.8. Glucose was 95.  Electrolytes within normal limits.  Her hematocrit was 38 and her platelet count was 362,000.  EKG shows normal sinus rhythm with a first degree AV block with frequent PVCs, there is a left atrial enlargement and left ventricular hypertrophy.  Cardiac catheterization was reviewed.  Findings are as noted as in the history of present illness.  IMPRESSION: [name] [name] is a 77 year old white female with a known history of coronary disease.  She now presents with symptoms which she calls angina but which are atypical, however, she does have a positive stress Cardiolite and significant three vessel disease by catheterization.  Her ejection fraction on her Cardiolite was only 36% but with her catheterization today, her ventricular function appeared better than that with an estimated ejection fraction of approximately 50%.  She has a history of mild mitral regurgitation.  There was not a great deal of regurgitation at catheterization but we will do a transesophageal echo intraoperatively just to check the valve although I suspect that there will not be any need for mitral valve repair.  I discussed the indications, risks, benefits, and alternative treatments in detail with [name] [name].  She is not a candidate for angioplasty due to the osteal left circumflex disease as well as her distal left knee  stenosis.  Coronary artery bypass grafting offers a survival benefit and superior quality of life to medical therapy given her continued symptoms while on extensive medical therapy.  I advised her of the risks of coronary artery bypass grafting include but are not limited to death, stroke, myocardial infarction, bleeding, possible need for blood transfusion with attending complications, infection, PE or DVT, or other organ system dysfunction including but not limited to kidneys, liver, gastrointestinal tract, and lungs.  She understands and accepts the risks, and agrees to proceed.  The first available operating date is Friday at 7:30 a.m.  We will schedule her to be done as leave of absence patient.  We will notify Dr. Katrinka Blazing at the time of her admission. Thank you very much. DD:  06/04/00 TD:  06/04/00 Job: [id number]

## 2010-10-19 NOTE — Assessment & Plan Note (Signed)
[name]                            CARDIOLOGY OFFICE NOTE   NAME:[name], [name]                       MRN:          [id number]  DATE:06/11/2005                            DOB:          10-Jul-1920    CARDIOLOGIST:  [name] [name]   PRIMARY CARE PHYSICIAN:  [name] [name]   HISTORY OF PRESENT ILLNESS:  [name] [name] is an 77 year old female patient  with a history of ischemic cardiomyopathy with EF by most recent  echocardiogram of 45% to 50% as well as coronary artery disease, status  post CABG in 2002 who recently saw [name] [name] for follow up.  She had  exertional left arm pain.  It was felt that this was probably angina and  she was set up for outpatient cardiac catheterization.  This was  performed on December 27 by [name] [name].  Her vein graft to the mid LAD,  vein graft to the 1st obtuse marginal and vein graft to the RCA were all  widely patent.  She had normal LV function.  [name] [name] did not see any  areas that warranted intervention and he recommended continued medical  therapy with up-titration of her anti-anginal therapy as tolerated.  The  patient presents to the office today for post catheterization followup.  She notes that ever since starting the Imdur she has had drops of her  blood pressure in the morning to the low 100s to upper 90s.  She is  symptomatic with this with light headedness and dizziness.  She denies  any syncope or falls.  She denies any recurrent exertional left arm  discomfort.  She has occasional shortness of breath with over exertion,  but there has been no change and no symptoms.  She denies any lower  extremity edema.   CURRENT MEDICATIONS:  1. Lipitor 80 mg daily.  2. Lisinopril 20 mg daily.  3. Carvedilol 25 mg twice a day.  4. Aspirin 81 mg b.i.d.  5. Imdur 30 mg daily.  6. Multivitamin.  7. Furosemide 20 mg 1/2 a tablet daily.   ALLERGIES:  No known drug allergies.   PHYSICAL EXAMINATION:  She is  a well-nourished, well-developed female.  She has blood pressures of 132/69 lying with a pulse of 67.  Sitting  118/66 with a pulse of 68.  Standing is 114/68 with a pulse of 69.  After 2 minutes 131/68 with a pulse of 75.  After 5 minutes 128/67 with  a pulse of 68.  HEENT:  Is unremarkable.  NECK:  Without JVD.  Carotids there is a left CEA scar.  No bruits  noted.  CARDIAC:  Normal S1, S2.  Regular rate and rhythm.  LUNGS:  Are clear to auscultation bilaterally with no rhonchi or rales.  ABDOMEN:  Is soft, nontender with normoactive bowel sounds, no  organomegaly.  EXTREMITIES:  Without edema.  Calves are soft, nontender.  SKIN:  Is warm and dry.  NEUROLOGIC:  She is alert and oriented x3.  Cranial nerves II through  XII grossly intact.  Right groin without hematoma or bruit.   IMPRESSION:  1. Coronary artery disease, status post coronary artery bypass graft      in 2002.      a.     A recent history of exertional left arm pain - anginal       equivalent.      b.     Three or four grafts patent by recent catheterization -       medical therapy.  2. History of ischemic cardiomyopathy with most recent echocardiogram      showing improved left ventricular function to an ejection fraction      of 45% to 50% with normal left ventricular function noted at      catheterization.  3. History of syncope in March 2007 with negative workup.  4. History of complete heart block, status post permanent pacemaker      implantation.  5. Hyperlipidemia.  6. Peripheral arterial disease.      a.     Status post carotid endarterectomy November 1995.      b.     Zero to 39% bilateral internal carotid artery stenosis by       Dopplers 2004.  7. Moderate mitral regurgitation and tricuspid regurgitation.  8. Symptomatic hypotension with some orthostasis.   PLAN:  The patient presents to the office today for a post  catheterization followup.  She has noted some drops in her blood  pressure that she  is symptomatic with.  This is fairly consistent with  her Imdur.  It usually occurs about 2 hours after taking the  medications.  On her orthostatic vital signs, she does drop her blood  pressure when she goes from lying to sitting, but her pressure recovers  after 2 minutes of standing and she was asymptomatic with this.  I  recommended that she decrease her Imdur 1/2 tablet a day to 15 mg a day  and take this at bedtime to see if it will alleviate some of her  symptoms.  She will follow up with [name] [name] in the next 2 to 3 months.  If she develops recurrent left arm pain with exertion or if she  continues to have episodic dizziness and low blood pressure she is to  contact us and we will see her sooner.  She did ask about help with  prescription for a sleep aide.  Specifically she wants Ambien.  I have  asked her to follow up with Dr. Fabian [name] for this.  She also wanted to  know about a varicosity in her right leg, in the popliteal fossa.  This  is stable on exam and it is not bothering her.  I have recommended  watchful waiting for now without intervention unless it begins to bother  her or cause significant problems.  She had some stomach discomfort with  ASA 325mg   daily.  She reduced this to 81 mg BID.  I recommended she reduce it to  81 mg daily at this point in time.  She will see [name] [name] in the next 2  to 3 months.      [name] Newcomer, PA-C  Electronically Signed      [name] Abed, MD, [facility]  Electronically Signed   SW/MedQ  DD: 06/11/2006  DT: 06/11/2006  Job #: [id number]   cc:   [name] Mends. Fabian Sharp, MD

## 2010-10-19 NOTE — Op Note (Signed)
[place]. [facility]  Patient:    [name] [name], [name] [name]                     MRN: [id number] Proc. Date: 06/06/00 Adm. Date:  [id number] Attending:  Charlett Lango CC:         Verdis Prime, M.D.   Operative Report  PREOPERATIVE DIAGNOSES:  Three-vessel coronary disease and mild mitral regurgitation.  POSTOPERATIVE DIAGNOSES:  Three-vessel coronary disease and mild mitral regurgitation.  PROCEDURE:  Median sternotomy, extracorporeal circulation, take down of intrapericardial adhesions, coronary artery bypass grafting x 4 (left internal mammary artery to proximal left anterior descending, saphenous vein graft to distal left anterior descending, saphenous vein graft to second obtuse marginal one, saphenous vein graft to distal right coronary artery).  SURGEON:  Salvatore Decent. Dorris Fetch, M.D.  ASSISTANT:  Maxwell Marion, RNFA  ANESTHESIA:  General.  FINDINGS:  Severe intrapericardial adhesions.  Mammary and saphenous vein of good quality.  Good targets.  Transesophageal echocardiography revealed 1+ MR pre and postbypass and good left ventricular function pre and postbypass.  CLINICAL NOTE:  Ms. Nevers is a 77 year old female with a known history of coronary disease.  She now presents with progressive symptoms.  She had a positive Cardiolite.  She underwent cardiac catheterization, which revealed 3-vessel coronary disease.  The patient was referred for coronary bypass grafting.  The indications, risks, benefits and alternatives were discussed in detail with the patient.  She had a history of mild mitral and tricuspid regurgitation and therefore was planned to do intraoperative transesophageal echocardiography to inspect the mitral valve at the time of surgery.  OPERATIVE NOTE:  Ms. Lotter was brought to the preoperative holding area on June 06, 2000 and lines were placed to monitor arterial, central venous  and pulmonary arterial pressure.  EKG leads  were placed for continuous telemetry. The patient was taken to the operating room, anesthetized and intubated.  A Foley catheter was placed, intravenous antibiotics were administered.  The chest, abdomen and legs were prepped and draped in the usual fashion. Transesophageal echocardiography was performed.  It revealed good left ventricular function.  There was 1+ mitral regurgitation.  A median sternotomy was performed.  The left internal mammary artery was harvested in the standard fashion.  Following take down of the mammary artery, the patient was fully heparinized before dividing the distal end of the vessel.  There was excellent flow through the cut end of the mammary.  The mammary was placed in a papaverine-soaked sponge and placed into the left pleural space.  Simultaneously, an incision was made in the medial aspect of the right leg and the greater saphenous vein was harvested from the ankle to the mid calf.  The vein took a posterior course and became small, therefore, additional vein was harvested from the left ankle.  After harvesting the conduit, the pericardium was incised.  There were dense adhesions of the pericardium to the aorta and heart.  These adhesions were taken down with sharp dissection anteriorly.  Pericardial crater was created.  The adhesions were dissected off enough to allow cannulation of the aorta and right atrium. The adhesions were very dense over the anterior wall of the right ventricle at the apex and on the diaphragmatic surface of the heart and could not be taken down prior to initiating cardiopulmonary bypass.  The aorta was inspected and palpated.  There was a palpable atherosclerotic plaque near the takeoff of the innominate artery.  This did not interfere with  placement of the cross-clamp. The remainder of the ascending aorta was normal to palpation.  The aorta was cannulated via concentric 2-0 Ethibond pledgeted purse-string sutures.  A dual-stage  venous cannula was placed via purse-string suture in the right atrial appendage.  Cardiopulmonary bypass was instituted and the patient was cooled to 32 degrees Celcius.  The remainder of the intrapericardial adhesions then were taken down.  The coronary arteries were inspected and anastomotic sites were chosen.  Of note, during inspection of the LAD, there was proximal moderate disease in the LAD.  The mid portion of the LAD was good quality, large vessel until the takeoff of a large diagonal branch.  The LAD then took a tortuous course and had a questionable stenosis beyond the takeoff of this diagonal and then was a small vessel to the apex.  The artery was inspected just beyond the takeoff of the diagonal branch and there was a calcified plaque in that area and it was elected to bypass the distal as well as the more proximal-mid LAD.  A foam pad was placed in the pericardium to protect the left phrenic nerve.  A temperature probe was placed in the myocardial septum and a cardioplegia cannula was placed in the ascending aorta.  The aorta was cross-clamped.  The left ventricle was emptied via the aortic root vent.  Cardiac arrest then was achieved with a combination of cold antegrade blood cardioplegia and topical ice saline.  After achieving a complete diastolic arrest and myocardial septal temperature of less than 10 degrees Celcius, the following distal anastomoses were performed.  First, a reverse saphenous vein graft was placed end-to-side to the distal right coronary artery at the level of the takeoff of the posterior descending. A 1.5 mm probe passed into both the continuation of the right and into the posterior descending. There was some plaquing at the bifurcation, but it was not stenotic.  The vein graft and artery were both of good quality at the site of anastomosis.  It was performed with a running 7-0 Prolene suture.  At the completion of the anastomosis, there was good flow  through the graft. Cardioplegia was administered and there was good hemostasis.   Next, a reverse saphenous vein graft was placed end-to-side to the first obtuse marginal branch of the left circumflex coronary artery.  This was a large dominant lateral branch.  There was an 85% ostial left circumflex stenosis.  The circumflex was 1.5 mm in diameter.  The vein graft was of good quality.  The anastomosis was performed end-to-side with a running 7-0 Prolene suture.  It was probed proximally and distally to ensure patency. Cardioplegia was administered.  There was good flow through the anastomosis and there was good hemostasis.  Next, a reverse saphenous vein graft was placed end-to-side to the distal LAD.  The vessel in this portion was 1.3 mm in diameter and a 1 mm probe passed distally to the apex, but would not pass proximally due to tortuosity and stenosis in the LAD proximal to the site.  The anastomosis was performed with a running 7-0 Prolene in an end-to-side fashion.  Again there was good flow through this graft.  Cardioplegia was administered and there was good hemostasis.  Cardioplegia was also administered via the aortic root at this point in the procedure because of electrical activity.  Electrical activity resolved.  Next, the left internal mammary artery was brought through a window in the pericardium anterior to the left phrenic nerve.  The distal  end of the mammary was spatulated.  This was a 1.8 mm vessel.  It was anastomosed end-to-side to the mid LAD proximal to the takeoff of the large diagonal branch.  The LAD at this site was a 2 mm good quality target.  The anastomosis was performed with a running 8-0 Prolene suture in an end-to-side fashion.  At the completion of the mammary to LAD anastomosis, the bulldog clamp was briefly removed to inspect for hemostasis.  Septal rewarming occurred.  The bulldog clamp was replaced and additional cardioplegia was administered.   The mammary pedicle was tacked to the epicardial surface of the heart with 6-0 Prolene sutures. There was good cooling of the septum with the cardioplegia.  The vein grafts were cut to length, the cardioplegia cannula was removed from the ascending aorta and the proximal vein graft anastomoses were performed to 4.0 mm punch aortotomies with running 6-0 Prolene sutures.  At the completion of the final proximal anastomosis, the patient was placed in Trendelenburg position, the bulldog clamp was removed from the mammary artery, septal rewarming was noted, lidocaine was administered.  The aortic root was allowed to fill with blood to expel any air.  The cross-clamp was slowly removed, again, allowing any air to vent as the clamp was removed a suture then was tied, air was aspirated from the vein grafts, and the bulldog clamps were removed.  All proximal and distal anastomoses were inspected for hemostasis.  Epicardial pacing wires were placed on the right atrium.  The patient had an indwelling DDD pacemaker and wires were not placed on the ventricle because of the intraventricular internal pacemaker lead was The patient was working well.  The patient had been rewarmed during the performance of the proximal anastomoses.  When the core temperature reached 37 degrees Celcius, she was weaned from cardiopulmonary bypass without difficulty.  She was on no inotropic support at the time of separation from bypass.  From both visual inspection of the heart and transesophageal echocardiography, there was excellent cardiac function. All indexes were stable.  Patient had a low cardiac index, which did not correlate with the clinical findings by either echocardiogram or direct inspection.  The patient remained hemodynamically stable throughout the postbypass period and no inotropic support was instituted.  A test dose of protamine was administered and was well tolerated.  The atrial and aortic cannulae were  removed.  There was good hemostasis at both cannulation sites.  The remainder of the protamine was administered without incident.  The chest was irrigated with 1 L of warm normal saline containing 1 g of vancomycin.  Hemostasis was achieved.  A left pleural and two mediastinal chest tubes were placed through separate subcostal incisions.  The pericardium could not be reapproximated.  The sternal fat was covered over the heart and aorta with interrupted 3-0 silk sutures.  The sternum was closed with heavy gauge interrupted stainless steel wires.  The pectoralis fascia was closed with a running #1 Vicryl suture.  The subcutaneous tissue was closed a running 2-0 Vicryl suture and the skin was closed with a 3-0 Vicryl subcuticular suture.  All sponge, needle and instrument counts were correct at the end of the procedure. DD:  06/06/00 TD:  06/06/00 Job: [id number]

## 2010-11-06 ENCOUNTER — Telehealth: Payer: Self-pay | Admitting: *Deleted

## 2010-11-06 MED ORDER — ALPRAZOLAM 0.25 MG PO TABS: ORAL_TABLET | ORAL | Status: DC

## 2010-11-06 NOTE — Telephone Encounter (Signed)
Refill on alprazolam 0.25mg  last filled on 09/17/10

## 2010-11-06 NOTE — Telephone Encounter (Signed)
Rx faxed to pharmacy. Per Dr. Fabian Sharp ok x 1

## 2010-11-07 ENCOUNTER — Encounter: Payer: Self-pay | Admitting: Internal Medicine

## 2010-11-19 ENCOUNTER — Telehealth: Payer: Self-pay | Admitting: *Deleted

## 2010-11-19 ENCOUNTER — Ambulatory Visit (INDEPENDENT_AMBULATORY_CARE_PROVIDER_SITE_OTHER): Payer: Medicare Other | Admitting: Internal Medicine

## 2010-11-19 ENCOUNTER — Encounter: Payer: Self-pay | Admitting: Internal Medicine

## 2010-11-19 VITALS — BP 140/90 | HR 78 | Temp 98.5°F | Wt 150.0 lb

## 2010-11-19 DIAGNOSIS — F419 Anxiety disorder, unspecified: Secondary | ICD-10-CM

## 2010-11-19 DIAGNOSIS — F411 Generalized anxiety disorder: Secondary | ICD-10-CM

## 2010-11-19 DIAGNOSIS — Z950 Presence of cardiac pacemaker: Secondary | ICD-10-CM

## 2010-11-19 DIAGNOSIS — M545 Low back pain: Secondary | ICD-10-CM

## 2010-11-19 DIAGNOSIS — F4323 Adjustment disorder with mixed anxiety and depressed mood: Secondary | ICD-10-CM

## 2010-11-19 DIAGNOSIS — I2510 Atherosclerotic heart disease of native coronary artery without angina pectoris: Secondary | ICD-10-CM

## 2010-11-19 LAB — POCT URINALYSIS DIPSTICK
Bilirubin, UA: NEGATIVE
Blood, UA: NEGATIVE
Ketones, UA: NEGATIVE
pH, UA: 5

## 2010-11-19 NOTE — Progress Notes (Signed)
  Subjective:    Patient ID: [name] [name], female    DOB: November 29, 1920, 77 y.o.   MRN: [id number]  HPI Patient comes in today for an acute appointment for problem has been going on for a week. See above. Onset insidiously after  9 days ago during trip to [employer] trip.    The next  Day hips and then lower back hurt to get up out of bed.  Hard to get out of chair. Is stiff .  Hard to get out of bed in am pain is better with rest.  Taking tylenol arthritis  . That helps.  But then comes back.   Also has some stress (  Sister died age 32).   No radiation and no weakness in legs .Marland Kitchen No frequency or fever sweats  ? If burning .  Took her amox yesterday but no change  .  No injury or fall  Review of Systems Neg cp sob or swelling and no  Rash weight loss  Past history family history social history reviewed in the electronic medical record.     Objective:   Physical Exam wdwn in nad looks stressed  Moves easily around room but reports  ls area pain .  Oriented x 3 and no noted deficits in memory, attention, and speech. MS good toe heel walk no weakness or changei n balance . Dtrs present. Chest:  Clear to A&P without wheezes rales or rhonchi CV:  S1-S2 no gallops or murmurs peripheral perfusion is normal  Slight edema legs.  No clubbing cyanosis    Slight edema  Mood slightly distressed but appropriate for situation. UA reviewed       Assessment & Plan:  Back pain   Seems MS cause  No obv UTI .  Disc use of tylenol and time   If  persistent or progressive will need more evaluation x ray etc.  Reactive stress  Sis just died .    Expectant management.  Total visit > 50% spent counseling and coordinating care   CAD PAcer stable

## 2010-11-19 NOTE — Telephone Encounter (Signed)
Pt called leaving a voicemail saying that she wants to see Dr. Fabian Sharp and if she can't see Dr. Fabian Sharp then she will go somewhere else. I tried to call pt back but was unable to leave a voice mail to have pt call back and schedule an appt.

## 2010-11-19 NOTE — Patient Instructions (Signed)
Continue tylenol as you are doing Expect to improve over the next few weeks.  Call  If  Get fever or there concern    in the meantime Ithink this is a mild back strain and you probably have arthritis in the back

## 2010-11-19 NOTE — Telephone Encounter (Signed)
Pt has an appt for today.

## 2010-12-03 ENCOUNTER — Telehealth: Payer: Self-pay | Admitting: *Deleted

## 2010-12-03 ENCOUNTER — Encounter: Payer: Self-pay | Admitting: Internal Medicine

## 2010-12-03 ENCOUNTER — Ambulatory Visit (INDEPENDENT_AMBULATORY_CARE_PROVIDER_SITE_OTHER): Payer: Medicare Other | Admitting: Internal Medicine

## 2010-12-03 DIAGNOSIS — I2510 Atherosclerotic heart disease of native coronary artery without angina pectoris: Secondary | ICD-10-CM

## 2010-12-03 DIAGNOSIS — Z950 Presence of cardiac pacemaker: Secondary | ICD-10-CM

## 2010-12-03 DIAGNOSIS — I498 Other specified cardiac arrhythmias: Secondary | ICD-10-CM

## 2010-12-03 DIAGNOSIS — I442 Atrioventricular block, complete: Secondary | ICD-10-CM

## 2010-12-03 LAB — PACEMAKER DEVICE OBSERVATION
ATRIAL PACING PM: 1
BAMS-0001: 180 {beats}/min
BAMS-0003: 70 {beats}/min
BATTERY VOLTAGE: 2.9478 V
VENTRICULAR PACING PM: 100

## 2010-12-03 NOTE — Assessment & Plan Note (Signed)
The patient's device was interrogated.  The information was reviewed. No changes were made in the programming.    

## 2010-12-03 NOTE — Assessment & Plan Note (Signed)
Stable on currnet meds  

## 2010-12-03 NOTE — Telephone Encounter (Signed)
Per Dr. Fabian Sharp- Privacy guidelines thru HIPPA required [place] to have permission from the patient before we can discuss their medical needs.  I left her daughter a message on machine about this.

## 2010-12-03 NOTE — Progress Notes (Signed)
  HPI  [name] [name] is a 77 y.o. female  seen in followup for ischemic heart disease with a modest depression of LV function. She is status post bypass surgery in 2010 and catheterization in 2007. She is near normal left ventricular function.   She has a history of complete heart block and is status post pacemaker implantation .  She also has a history of atrial tachycardia She has been quite well without cardiovascular symptoms.The patient denies SOB, chest pain, edema or palpitations  Her sister of 98 died two weeks ago  Past Medical History  Diagnosis Date  . CAD (coronary artery disease)   . LV dysfunction     intercurrent normalization of lv function- cath 2007  . Mitral regurgitation     moderate  . Syncope   . Heart block   . Pacemaker     BiV (s/p)  . Hyperlipidemia   . Osteoarthritis   . Decreased hearing     left ear  . Hyperkalemia   . Macular degeneration     impaired vision  . Anxiety      on med bid since 2007  . Carotid arterial disease     sp cae     Past Surgical History  Procedure Date  . Coronary angioplasty 2007  . Abdominal hysterectomy   . Pacemaker placement 1995 and replaced April 2010  . Coronary artery bypass graft 1995    x4  . Carotid endarterectomy 1995  . Rt knee surgery 05/2009    [facility]    Current Outpatient Prescriptions  Medication Sig Dispense Refill  . ALPRAZolam (XANAX) 0.25 MG tablet Take 1/2 tab in am and 1 tab at bedtime.  60 tablet  0  . aspirin 81 MG chewable tablet Chew 81 mg by mouth daily.        Marland Kitchen atorvastatin (LIPITOR) 80 MG tablet Take 80 mg by mouth daily.        . carvedilol (COREG) 12.5 MG tablet Take 1 tablet (12.5 mg total) by mouth 2 (two) times daily with a meal.  60 tablet  10  . Cholecalciferol (VITAMIN D) 1000 UNITS capsule Take 1,000 Units by mouth daily.        . furosemide (LASIX) 20 MG tablet Take 20 mg by mouth daily.        . isosorbide mononitrate (IMDUR) 30 MG 24 hr tablet TAKE 1/2 TABLET EVERY  DAY  30 tablet  5  . Multiple Vitamins-Minerals (CENTRUM SILVER PO) Take by mouth.        . Multiple Vitamins-Minerals (ICAPS PO) Take by mouth.        . DISCONTD: diphenhydramine-acetaminophen (TYLENOL PM) 25-500 MG TABS Take 1 tablet by mouth at bedtime as needed.          Allergies  Allergen Reactions  . Hydrocodone   . Ibuprofen     Review of Systems negative except from HPI and PMH  Physical Exam Well developed and well nourished in no acute distress HENT normal E scleral and icterus clear Neck Supple JVP flat; carotids brisk and full Clear to ausculation Regular rate and rhythm, no murmurs gallops or rub Soft with active bowel sounds No clubbing cyanosis and edema Alert and oriented, grossly normal motor and sensory function Skin Warm and Dry   Assessment and  Plan

## 2010-12-03 NOTE — Assessment & Plan Note (Signed)
No interciurrent atrial tachycardia

## 2010-12-03 NOTE — Telephone Encounter (Signed)
Pt doesn't have anything signed saying that we can talk to her daughter about this.

## 2010-12-03 NOTE — Telephone Encounter (Signed)
[name] [name] (daughter) left a voicemail saying that her mom is very depressed. She just lost her last sister 1.5 weeks ago. She is very angry as well. She also states that pt is showing signs of dependency on the xanax. Her daughter states that they would like her to be on anxiety depressant medication but states that the patient doesn't want to change her medications. Her daughter also said that we only need to talk to her and not the patient about this because she doesn't want to get her mom worked up.

## 2010-12-03 NOTE — Assessment & Plan Note (Signed)
Stable post pacer 

## 2010-12-03 NOTE — Telephone Encounter (Signed)
If needed she can come to appt with her mom  To facilitate communication.   (Xanax was begun by the cardiologist in the past to help with her situation . And then it was requested that we follow it up.) Patient should make office visit ( 2 slots ) to discuss needs if she wishes.

## 2010-12-03 NOTE — Patient Instructions (Signed)
Your physician wants you to follow-up in: 1 year  You will receive a reminder letter in the mail two months in advance. If you don't receive a letter, please call our office to schedule the follow-up appointment.  Your physician recommends that you continue on your current medications as directed. Please refer to the Current Medication list given to you today.  

## 2010-12-07 NOTE — Telephone Encounter (Signed)
Per Dr. Fabian Sharp- pt to come in on July 16

## 2010-12-07 NOTE — Telephone Encounter (Signed)
Spoke to pt she takes 1/2 in the am and 1 at bedtime. Pt states that it is okay to talk to [place]. She is willing to come anytime.

## 2010-12-07 NOTE — Telephone Encounter (Signed)
Spoke with daughter and she is willing to come in with pt to discuss their concerns.

## 2010-12-10 ENCOUNTER — Ambulatory Visit: Payer: Medicare Other | Admitting: Internal Medicine

## 2010-12-10 ENCOUNTER — Ambulatory Visit (INDEPENDENT_AMBULATORY_CARE_PROVIDER_SITE_OTHER)
Admission: RE | Admit: 2010-12-10 | Discharge: 2010-12-10 | Disposition: A | Discharge status: Home / Self Care | Payer: Medicare Other | Source: Ambulatory Visit | Attending: Internal Medicine | Admitting: Internal Medicine

## 2010-12-10 ENCOUNTER — Encounter: Payer: Self-pay | Admitting: Internal Medicine

## 2010-12-10 ENCOUNTER — Ambulatory Visit (INDEPENDENT_AMBULATORY_CARE_PROVIDER_SITE_OTHER): Payer: Medicare Other | Admitting: Internal Medicine

## 2010-12-10 VITALS — BP 100/80 | HR 78 | Temp 98.7°F | Wt 151.0 lb

## 2010-12-10 DIAGNOSIS — M1990 Unspecified osteoarthritis, unspecified site: Secondary | ICD-10-CM

## 2010-12-10 DIAGNOSIS — R300 Dysuria: Secondary | ICD-10-CM

## 2010-12-10 DIAGNOSIS — F4323 Adjustment disorder with mixed anxiety and depressed mood: Secondary | ICD-10-CM

## 2010-12-10 DIAGNOSIS — M549 Dorsalgia, unspecified: Secondary | ICD-10-CM

## 2010-12-10 LAB — POCT URINALYSIS DIPSTICK
Ketones, UA: NEGATIVE
Leukocytes, UA: NEGATIVE
Protein, UA: NEGATIVE
pH, UA: 6.5

## 2010-12-10 MED ORDER — CIPROFLOXACIN HCL 250 MG PO TABS: 250.0000 mg | ORAL_TABLET | Freq: Two times a day (BID) | ORAL | Status: AC

## 2010-12-10 NOTE — Progress Notes (Signed)
  Subjective:    Patient ID: [name] [name], female    DOB: 10/12/1920, 77 y.o.   MRN: [id number]  HPI  Comes in for acute add on apt today for above . She apparently has been having some low back pain recently and yesterday was very bad. Hurt to urinate.    Back pain was bad  Lower and flank area both sides. No new hematuria possibly no change in frequency. Had travel to [place] and back had difficulty getting out of the car. No falls no bradycardia patient on the leg no fever or chills. She generally feels bad her sister died there was difficulty with the funeral her husband has dementia. Her family is also worried about her mood. She has an appointment with [place] for consultation next week.    Took 4 pills of amoxicillin yesterday and 4 today her back pain is eased off some as well as for dysuria. She wants some treatment for possible urine infection.   Took tylenol for pain.  Review of Systems Negative for chest pain shortness of breath fever falling numbness in her leg bruising bleeding hematuria see history of present illness  Past history family history social history reviewed in the electronic medical record.     Objective:   Physical Exam wdwn in nad looks tired and stressed . Gait  Up without support  Slow   NO point tenderesnns  resport area of ls are and some upper flank. Mood  Somewhat depressed . Nl speech and attention and thought.      UA clear   Assessment & Plan:  ? Partly rx uti vs other  dysuria some improved  Back pain   And dysuria.  ? If djd or spinal related because Painful in car. And with prolonged sitting better with some rest.  Because she started the antibiotic will go ahead and culture anyway and jaw out of medication and low-dose Cipro with caution to 50 twice a day for 7 days  Depression  /anxiety stress   Will disc more  a t next visit . Many things stressful in her life. Daughter suggests she be on an antidepressant and go off the Xanax. Office visit  next week with family involved. She definitely has multiple losses recently.  Total visit > 50% spent counseling and coordinating care

## 2010-12-10 NOTE — Patient Instructions (Addendum)
Take antibiotic in case this is partly treated infection.  Get back x ray today or tomorrow when you can. Continue tylenol for pain. Avoid prolonged sitting. Will contact you about results when available.

## 2010-12-12 ENCOUNTER — Telehealth: Payer: Self-pay | Admitting: *Deleted

## 2010-12-12 LAB — URINE CULTURE
Colony Count: NO GROWTH
Organism ID, Bacteria: NO GROWTH

## 2010-12-12 NOTE — Telephone Encounter (Signed)
Pt. Is calling for xray results.

## 2010-12-13 ENCOUNTER — Telehealth: Payer: Self-pay | Admitting: *Deleted

## 2010-12-13 DIAGNOSIS — M1990 Unspecified osteoarthritis, unspecified site: Secondary | ICD-10-CM

## 2010-12-13 NOTE — Telephone Encounter (Signed)
Left message to call back  

## 2010-12-13 NOTE — Telephone Encounter (Signed)
Pt aware and order sent to [facility]. 

## 2010-12-13 NOTE — Telephone Encounter (Signed)
See other phone note

## 2010-12-13 NOTE — Telephone Encounter (Signed)
Message copied by Romualdo Bolk on Thu Dec 13, 2010  9:16 AM ------      Message from: [facility], [place] K      Created: Wed Dec 12, 2010  9:32 AM       Advise patient that her x ray shows arthitis in her spine .  This also could be causing her pain.  Will doscuss at her  Visit next week. We need to get orthopedics to see her if not getting better.

## 2010-12-13 NOTE — Telephone Encounter (Signed)
Pt is calling back for xray results, and states she is getting very angry.

## 2010-12-16 ENCOUNTER — Other Ambulatory Visit: Payer: Self-pay | Admitting: Internal Medicine

## 2010-12-17 ENCOUNTER — Other Ambulatory Visit: Payer: Self-pay | Admitting: *Deleted

## 2010-12-17 ENCOUNTER — Ambulatory Visit: Payer: Medicare Other | Admitting: Internal Medicine

## 2010-12-17 NOTE — Telephone Encounter (Signed)
Refill on alprazolam 0.25mg  last filled on 11/06/10 #60 LOV 12/10/10 NOV 03/01/11

## 2010-12-17 NOTE — Telephone Encounter (Signed)
Call patient and see how she is doing mood wise( see last phone note and plan for visit   Regarding depression) we need to find a time to / reschedule visit to  discuss this situation.

## 2010-12-18 ENCOUNTER — Other Ambulatory Visit: Payer: Self-pay | Admitting: Internal Medicine

## 2010-12-19 NOTE — Telephone Encounter (Signed)
Spoke to pt and she states that she is going to see Dr. Pete Glatter. She saw him 2 days ago. This is her husband's MD. She states that he did give her a rx for depression which she started on today. Dx denied.

## 2010-12-25 NOTE — Telephone Encounter (Signed)
Transfer of care to different PCP is noted.

## 2011-03-01 ENCOUNTER — Ambulatory Visit: Payer: Medicare Other | Admitting: Internal Medicine

## 2011-03-07 ENCOUNTER — Encounter: Payer: Medicare Other | Admitting: *Deleted

## 2011-03-11 ENCOUNTER — Encounter: Payer: Self-pay | Admitting: *Deleted

## 2011-03-18 ENCOUNTER — Ambulatory Visit (INDEPENDENT_AMBULATORY_CARE_PROVIDER_SITE_OTHER): Payer: Medicare Other | Admitting: *Deleted

## 2011-03-18 DIAGNOSIS — I442 Atrioventricular block, complete: Secondary | ICD-10-CM

## 2011-03-18 DIAGNOSIS — Z950 Presence of cardiac pacemaker: Secondary | ICD-10-CM

## 2011-03-19 ENCOUNTER — Other Ambulatory Visit: Payer: Self-pay | Admitting: Internal Medicine

## 2011-03-19 ENCOUNTER — Encounter: Payer: Self-pay | Admitting: Internal Medicine

## 2011-03-19 LAB — REMOTE PACEMAKER DEVICE
BRDY-0004RV: 130 {beats}/min
DEVICE MODEL PM: 2270306

## 2011-03-20 NOTE — Progress Notes (Signed)
Pacer remote check  

## 2011-04-09 ENCOUNTER — Encounter: Payer: Self-pay | Admitting: *Deleted

## 2011-05-20 ENCOUNTER — Encounter: Payer: Self-pay | Admitting: Internal Medicine

## 2011-05-20 ENCOUNTER — Telehealth: Payer: Self-pay | Admitting: Internal Medicine

## 2011-05-20 ENCOUNTER — Ambulatory Visit (INDEPENDENT_AMBULATORY_CARE_PROVIDER_SITE_OTHER): Payer: Medicare Other | Admitting: *Deleted

## 2011-05-20 DIAGNOSIS — I442 Atrioventricular block, complete: Secondary | ICD-10-CM

## 2011-05-20 LAB — PACEMAKER DEVICE OBSERVATION
AL AMPLITUDE: 1.1 mv
AL IMPEDENCE PM: 325 Ohm
AL THRESHOLD: 1.5 V
BATTERY VOLTAGE: 2.9629 V
RV LEAD AMPLITUDE: 12 mv
RV LEAD IMPEDENCE PM: 587.5 Ohm

## 2011-05-20 NOTE — Telephone Encounter (Signed)
I spoke with the patient. She states that in church over the past two sundays, she has had episodes of pre-syncope. She was sitting and became very hot and started fanning herself and taking deep breaths. She felt very close to passing out and felt if she stood up she would fall. She has not noted these episodes during the week. She does feel like her heart rates have been faster and stated this was the case when she became pre-syncopal yesterday. She does have access to do remote transmissions on her device, but she would like to come in and let Belenda Cruise and [place] check her. She will come in at 3:15pm today.

## 2011-05-20 NOTE — Telephone Encounter (Signed)
New msg Pt said she had an episode of syncope yesterday in church and she wants to talk to you.

## 2011-05-26 ENCOUNTER — Telehealth: Payer: Self-pay | Admitting: Nurse Practitioner

## 2011-05-26 NOTE — Telephone Encounter (Signed)
pts dtr called this am stating that today, while at church, participating in Sunday school - sitting -, the pt became hot and flushed and then was witnessed to slump her head and lose consciousness.  Bystanders attended to her and she apparently regained consciousness within a few seconds.  She was confused initially but gradually became more lucid.  approx 15 mins after the event someone was able to check her VS and these were reportedly NL.  She now feels fine and is w/o sequale.  I rec that pt consider ER evaluation as this has been recurrent (3rd week in a row - all events @ church) and unpredictable.  She was seen in device clinic last week with normal device interrogation, thus etiology of presyncope and syncope remains unclear.  pts dtr prefers to watch her today and plans to call the office in the am.  Pt is reportedly scheduled for carotid dopplers in our office later this week.  i advised that if pt has any recurrent Ss, that they should present to the ER.

## 2011-05-27 ENCOUNTER — Ambulatory Visit (INDEPENDENT_AMBULATORY_CARE_PROVIDER_SITE_OTHER): Payer: Medicare Other | Admitting: Cardiovascular Disease

## 2011-05-27 ENCOUNTER — Telehealth: Payer: Self-pay | Admitting: Internal Medicine

## 2011-05-27 ENCOUNTER — Encounter: Payer: Self-pay | Admitting: Cardiovascular Disease

## 2011-05-27 ENCOUNTER — Encounter: Payer: Self-pay | Admitting: Internal Medicine

## 2011-05-27 VITALS — BP 130/72 | HR 76 | Ht 62.0 in | Wt 147.0 lb

## 2011-05-27 DIAGNOSIS — R55 Syncope and collapse: Secondary | ICD-10-CM

## 2011-05-27 DIAGNOSIS — I2510 Atherosclerotic heart disease of native coronary artery without angina pectoris: Secondary | ICD-10-CM

## 2011-05-27 NOTE — Patient Instructions (Signed)
Your physician recommends that you schedule a follow-up appointment in: 3 weeks with [name]  Your physician has requested that you have an echocardiogram. Echocardiography is a painless test that uses sound waves to create images of your heart. It provides your doctor with information about the size and shape of your heart and how well your heart's chambers and valves are working. This procedure takes approximately one hour. There are no restrictions for this procedure.   Your physician has recommended that you wear an event monitor. Event monitors are medical devices that record the heart's electrical activity. Doctors most often [place] these monitors to diagnose arrhythmias. Arrhythmias are problems with the speed or rhythm of the heartbeat. The monitor is a small, portable device. You can wear one while you do your normal daily activities. This is usually used to diagnose what is causing palpitations/syncope (passing out).  30 day event monitor

## 2011-05-27 NOTE — Progress Notes (Signed)
History of Present Illness: 77 y.o. WF with history of ischemic heart disease with a modest depression of LV function, CAD status post bypass surgery, complete heart block s/p pacemaker implantation, atrial tachycardia added onto my schedule today as DOD for evaluation of syncope. She is followed by [name].   She tells me that she had been feeling well. Yesterday she was sitting in Sunday School and she passed out. She was told by her husband that she was sweaty and lost consciousness. There may have been abdominal pain but she is not sure.  She was confused for several minutes after the episode. A retired Engineer, civil (consulting) was in her class and was quickly at her side. There was a question of whether she had a seizure but no convulsions. Her BP and pulse were ok immediately after the episode. She is not a diabetic. Her pacemaker was interrogated last week and again today. She is mostly dependent on her pacemaker. She did have a 7 beat run of VT on 05/21/11 but that was unrelated to any of her symptoms. She has also felt the room spinning at times over the last few weeks and was started on Meclizine by Dr. Pete Glatter last week. She feels well. She has had no chest pain, SOB or awareness of palpitations. She had carotid dopplers done last in January 2012 and had a 0-39% RICA stenosis and 40-59% LICA stenosis. Plans for repeat dopplers next month.     Past Medical History  Diagnosis Date  . CAD (coronary artery disease)   . LV dysfunction     intercurrent normalization of lv function- cath 2007  . Mitral regurgitation     moderate  . Syncope   . Heart block   . Pacemaker     BiV (s/p)  . Hyperlipidemia   . Osteoarthritis   . Decreased hearing     left ear  . Hyperkalemia   . Macular degeneration     impaired vision  . Anxiety      on med bid since 2007  . Carotid arterial disease     sp cae     Past Surgical History  Procedure Date  . Coronary angioplasty 2007  . Abdominal hysterectomy   .  Pacemaker placement 1995 and replaced April 2010  . Coronary artery bypass graft 1995    x4  . Carotid endarterectomy 1995  . Rt knee surgery 05/2009    [facility]    Current Outpatient Prescriptions  Medication Sig Dispense Refill  . acetaminophen (TYLENOL) 325 MG tablet Take 650 mg by mouth every 6 (six) hours as needed.        . ALPRAZolam (XANAX) 0.25 MG tablet 0.5 mg at bedtime. .       . aspirin 81 MG chewable tablet Chew 81 mg by mouth daily.        Marland Kitchen atorvastatin (LIPITOR) 80 MG tablet        . carvedilol (COREG) 12.5 MG tablet Take 1 tablet (12.5 mg total) by mouth 2 (two) times daily with a meal.  60 tablet  10  . Cholecalciferol (VITAMIN D) 1000 UNITS capsule Take 1,000 Units by mouth daily.        . furosemide (LASIX) 20 MG tablet TAKE 1 TABLET EVERY DAY  30 tablet  4  . isosorbide mononitrate (IMDUR) 30 MG 24 hr tablet TAKE 1/2 TABLET EVERY DAY  30 tablet  5  . Melatonin 3 MG TABS Take 3 mg by mouth at bedtime.        Marland Kitchen  Multiple Vitamins-Minerals (CENTRUM SILVER PO) Take by mouth.        . Multiple Vitamins-Minerals (ICAPS PO) Take by mouth.        . sertraline (ZOLOFT) 25 MG tablet Take 25 mg by mouth daily. 1 1/2 po daily         Allergies  Allergen Reactions  . Hydrocodone   . Ibuprofen     History   Social History  . Marital Status: Married    Spouse Name: N/A    Number of Children: N/A  . Years of Education: N/A   Occupational History  . Not on file.   Social History Main Topics  . Smoking status: Never Smoker   . Smokeless tobacco: Not on file  . Alcohol Use: Yes  . Drug Use:   . Sexually Active:    Other Topics Concern  . Not on file   Social History Narrative   MarriedHH of 2Husband had aneurysm surgery in Feb 2010 is legally blindCaretaker for him, he is now on "memory medicine"  Some demetiaOther family stressesSis age 74 just died  2012Bereaved parent ...daughter died of [id number]    Family History  Problem Relation Age of Onset  .  Melanoma Daughter   . Other Mother     cerebral hemorrage  . Other Father     grangerene in one leg    Review of Systems:  As stated in the HPI and otherwise negative.   BP 130/72  Pulse 76  Ht 5\' 2"  (1.575 m)  Wt 147 lb (66.679 kg)  BMI 26.89 kg/m2  Physical Examination: General: Well developed, well nourished, NAD HEENT: OP clear, mucus membranes moist SKIN: warm, dry. No rashes. Neuro: No focal deficits Musculoskeletal: Muscle strength 5/5 all ext Psychiatric: Mood and affect normal Neck: No JVD, no carotid bruits, no thyromegaly, no lymphadenopathy. Lungs:Clear bilaterally, no wheezes, rhonci, crackles Cardiovascular: Regular rate and rhythm. No murmurs, gallops or rubs. Abdomen:Soft. Bowel sounds present. Non-tender.  Extremities: No lower extremity edema. Pulses are 2 + in the bilateral DP/PT.  EKG: NSR, rate 76 bpm. V-paced rhythm.

## 2011-05-27 NOTE — Assessment & Plan Note (Signed)
Stable. No indication for an ischemia workup at this time.

## 2011-05-27 NOTE — Assessment & Plan Note (Addendum)
Unclear etiology. Her pacemaker check today is ok. One short run NSVT that is not associated with episodes. This does not sound ischemic. She has no recent illnesses. Carotid disease is mild to moderate by carotid dopplers January 2012. Will repeat carotid dopplers, check echo to assess LV function (last echo 2010) and have her wear a 30 day event monitor. She will follow up with [name] in several weeks. She understands that she should stay well hydrated.

## 2011-05-27 NOTE — Telephone Encounter (Signed)
Reviewed with Dr Clifton James. He will see pt today at 11:15am. Pt agreed with appt  and I advised pt not to drive.

## 2011-05-27 NOTE — Telephone Encounter (Signed)
New msg Pt saidn she came in last week an her pacemaker was checked and she said everything was ok.She said she passed out at church yesterday and wanted to talk to someone. Please call her back

## 2011-05-27 NOTE — Telephone Encounter (Signed)
Talked with pt. Yesterday at Sunday School pt states she completely passed out. Pt states right before she passed out she felt wringing wet and was lightheaded. Pt states she is not sure how long she was out but was told her BP was OK about 10 minutes later. I will review with Dr Laverle Hobby).

## 2011-05-30 ENCOUNTER — Ambulatory Visit (HOSPITAL_COMMUNITY): Payer: Medicare Other | Attending: Internal Medicine | Admitting: Radiology

## 2011-05-30 DIAGNOSIS — I079 Rheumatic tricuspid valve disease, unspecified: Secondary | ICD-10-CM | POA: Insufficient documentation

## 2011-05-30 DIAGNOSIS — I2510 Atherosclerotic heart disease of native coronary artery without angina pectoris: Secondary | ICD-10-CM

## 2011-05-30 DIAGNOSIS — I059 Rheumatic mitral valve disease, unspecified: Secondary | ICD-10-CM | POA: Insufficient documentation

## 2011-05-30 DIAGNOSIS — R55 Syncope and collapse: Secondary | ICD-10-CM | POA: Insufficient documentation

## 2011-06-13 DIAGNOSIS — H6980 Other specified disorders of Eustachian tube, unspecified ear: Secondary | ICD-10-CM | POA: Diagnosis not present

## 2011-06-13 DIAGNOSIS — K5900 Constipation, unspecified: Secondary | ICD-10-CM | POA: Diagnosis not present

## 2011-06-13 DIAGNOSIS — H6990 Unspecified Eustachian tube disorder, unspecified ear: Secondary | ICD-10-CM | POA: Diagnosis not present

## 2011-06-20 ENCOUNTER — Other Ambulatory Visit: Payer: Self-pay | Admitting: Internal Medicine

## 2011-06-20 ENCOUNTER — Encounter: Payer: Medicare Other | Admitting: *Deleted

## 2011-06-28 ENCOUNTER — Other Ambulatory Visit: Payer: Self-pay | Admitting: *Deleted

## 2011-06-28 DIAGNOSIS — I6529 Occlusion and stenosis of unspecified carotid artery: Secondary | ICD-10-CM

## 2011-07-01 ENCOUNTER — Encounter (INDEPENDENT_AMBULATORY_CARE_PROVIDER_SITE_OTHER): Payer: Medicare Other | Admitting: *Deleted

## 2011-07-01 DIAGNOSIS — I6529 Occlusion and stenosis of unspecified carotid artery: Secondary | ICD-10-CM | POA: Diagnosis not present

## 2011-07-04 ENCOUNTER — Ambulatory Visit (INDEPENDENT_AMBULATORY_CARE_PROVIDER_SITE_OTHER): Payer: Medicare Other | Admitting: Internal Medicine

## 2011-07-04 ENCOUNTER — Encounter: Payer: Self-pay | Admitting: Internal Medicine

## 2011-07-04 DIAGNOSIS — I442 Atrioventricular block, complete: Secondary | ICD-10-CM | POA: Diagnosis not present

## 2011-07-04 DIAGNOSIS — R55 Syncope and collapse: Secondary | ICD-10-CM

## 2011-07-04 DIAGNOSIS — I2510 Atherosclerotic heart disease of native coronary artery without angina pectoris: Secondary | ICD-10-CM

## 2011-07-04 NOTE — Progress Notes (Signed)
[name] [name] is a 78 y.o. female  seen in followup for ischemic heart disease with a modest depression of LV function. She is status post bypass surgery in 2010 and catheterization in 2007. She is near normal left ventricular function.   She has a history of complete heart block and is status post pacemaker implantation .  She also has a history of atrial tachycardia  She was seen by Dr Caryl Ada for syncope at Christmas time--interogation of thedevice at that time showed one short episode of VT NS  is given a monitor which is on aluminate.  These episodes are stereo typical have recurred frequently. There is a prodrome of heat flushing and some nausea.   She has been quite well without cardiovascular symptoms.The patient denies SOB, chest pain, edema or palpitations     Past Medical History  Diagnosis Date  . CAD (coronary artery disease)   . LV dysfunction     intercurrent normalization of lv function- cath 2007  . Mitral regurgitation     moderate  . Syncope   . Heart block   . Pacemaker     BiV (s/p)  . Hyperlipidemia   . Osteoarthritis   . Decreased hearing     left ear  . Hyperkalemia   . Macular degeneration     impaired vision  . Anxiety      on med bid since 2007  . Carotid arterial disease     sp cae     Past Surgical History  Procedure Date  . Coronary angioplasty 2007  . Abdominal hysterectomy   . Pacemaker placement 1995 and replaced April 2010  . Coronary artery bypass graft 1995    x4  . Carotid endarterectomy 1995  . Rt knee surgery 05/2009    [facility]    Current Outpatient Prescriptions  Medication Sig Dispense Refill  . acetaminophen (TYLENOL) 325 MG tablet Take 650 mg by mouth every 6 (six) hours as needed.        . ALPRAZolam (XANAX) 0.25 MG tablet 0.5 mg at bedtime. .       . aspirin 81 MG chewable tablet Chew 81 mg by mouth daily.        Marland Kitchen atorvastatin (LIPITOR) 80 MG tablet Take 40 mg by mouth daily.       . carvedilol (COREG) 12.5 MG tablet  Take 1 tablet (12.5 mg total) by mouth 2 (two) times daily with a meal.  60 tablet  10  . Cholecalciferol (VITAMIN D) 1000 UNITS capsule Take 1,000 Units by mouth daily.        . furosemide (LASIX) 20 MG tablet TAKE 1 TABLET EVERY DAY  30 tablet  4  . isosorbide mononitrate (IMDUR) 30 MG 24 hr tablet TAKE 1/2 TABLET EVERY DAY  30 tablet  5  . meclizine (ANTIVERT) 12.5 MG tablet Take 12.5 mg by mouth daily as needed.        . Melatonin 3 MG TABS Take 3 mg by mouth at bedtime.        . Multiple Vitamins-Minerals (CENTRUM SILVER PO) Take by mouth.        . Multiple Vitamins-Minerals (ICAPS PO) Take by mouth.        . sertraline (ZOLOFT) 25 MG tablet Take 25 mg by mouth daily. 1 1/2 po daily         Allergies  Allergen Reactions  . Hydrocodone   . Ibuprofen     Review of Systems negative except from HPI  and PMH  Physical Exam Well developed and well nourished in no acute distress HENT normal E scleral and icterus clear Neck Supple JVP flat; carotids brisk and full Clear to ausculation Regular rate and rhythm, no murmurs gallops or rub Soft with active bowel sounds No clubbing cyanosis and edema Alert and oriented, grossly normal motor and sensory function Skin Warm and Dry  Event recorder was reviewed and demonstrated sinus rhythm paced beat  Assessment and  Plan

## 2011-07-04 NOTE — Assessment & Plan Note (Signed)
Stable post pacing 

## 2011-07-04 NOTE — Assessment & Plan Note (Signed)
Stable on current meds 

## 2011-07-04 NOTE — Assessment & Plan Note (Signed)
The patient is having recurrent syncope unassociated with rhythm disturbance and accompanied by epiphenomenon consistent with neurally mediated reflexes. Many of them have occurred in church. It may be 80 and temperature. We have reviewed the physiology and potential countermeasures i.e. isometric contraction. I have also suggested that she become recumbent with the onset of her symptoms and is less and nursing to fall and does lie down. She is further alerted as to predisposing factors like heat and dehydration

## 2011-07-12 DIAGNOSIS — R300 Dysuria: Secondary | ICD-10-CM | POA: Diagnosis not present

## 2011-08-15 DIAGNOSIS — H04129 Dry eye syndrome of unspecified lacrimal gland: Secondary | ICD-10-CM | POA: Diagnosis not present

## 2011-08-22 ENCOUNTER — Encounter: Payer: Self-pay | Admitting: Internal Medicine

## 2011-08-22 ENCOUNTER — Ambulatory Visit (INDEPENDENT_AMBULATORY_CARE_PROVIDER_SITE_OTHER): Payer: Medicare Other | Admitting: *Deleted

## 2011-08-22 DIAGNOSIS — I442 Atrioventricular block, complete: Secondary | ICD-10-CM

## 2011-08-23 LAB — REMOTE PACEMAKER DEVICE
AL AMPLITUDE: 3.9 mv
AL IMPEDENCE PM: 360 Ohm
BAMS-0001: 180 {beats}/min
BATTERY VOLTAGE: 2.96 V
DEVICE MODEL PM: 2270306
RV LEAD AMPLITUDE: 12 mv

## 2011-09-03 NOTE — Progress Notes (Signed)
PPM remote 

## 2011-09-06 ENCOUNTER — Encounter: Payer: Self-pay | Admitting: *Deleted

## 2011-09-09 ENCOUNTER — Other Ambulatory Visit: Payer: Self-pay

## 2011-09-12 ENCOUNTER — Other Ambulatory Visit: Payer: Self-pay

## 2011-09-12 MED ORDER — CARVEDILOL 12.5 MG PO TABS: 12.5000 mg | ORAL_TABLET | Freq: Two times a day (BID) | ORAL | Status: DC

## 2011-10-08 DIAGNOSIS — L723 Sebaceous cyst: Secondary | ICD-10-CM | POA: Diagnosis not present

## 2011-11-08 ENCOUNTER — Other Ambulatory Visit: Payer: Self-pay | Admitting: Cardiovascular Disease

## 2011-11-11 ENCOUNTER — Other Ambulatory Visit: Payer: Self-pay | Admitting: Cardiovascular Disease

## 2011-11-12 ENCOUNTER — Other Ambulatory Visit: Payer: Self-pay | Admitting: *Deleted

## 2011-11-12 MED ORDER — CARVEDILOL 12.5 MG PO TABS: 12.5000 mg | ORAL_TABLET | Freq: Two times a day (BID) | ORAL | Status: DC

## 2011-11-18 ENCOUNTER — Other Ambulatory Visit: Payer: Self-pay | Admitting: Cardiology

## 2011-11-18 MED ORDER — ATORVASTATIN CALCIUM 80 MG PO TABS: 40.0000 mg | ORAL_TABLET | Freq: Every day | ORAL | Status: DC

## 2011-11-18 MED ORDER — CARVEDILOL 12.5 MG PO TABS: 12.5000 mg | ORAL_TABLET | Freq: Two times a day (BID) | ORAL | Status: DC

## 2011-11-28 ENCOUNTER — Encounter: Payer: Self-pay | Admitting: *Deleted

## 2011-12-04 ENCOUNTER — Ambulatory Visit (INDEPENDENT_AMBULATORY_CARE_PROVIDER_SITE_OTHER): Payer: Medicare Other | Admitting: Internal Medicine

## 2011-12-04 ENCOUNTER — Encounter: Payer: Self-pay | Admitting: Internal Medicine

## 2011-12-04 VITALS — BP 98/56 | HR 77 | Ht 62.0 in | Wt 150.0 lb

## 2011-12-04 DIAGNOSIS — I2510 Atherosclerotic heart disease of native coronary artery without angina pectoris: Secondary | ICD-10-CM | POA: Diagnosis not present

## 2011-12-04 DIAGNOSIS — E785 Hyperlipidemia, unspecified: Secondary | ICD-10-CM

## 2011-12-04 DIAGNOSIS — R55 Syncope and collapse: Secondary | ICD-10-CM

## 2011-12-04 DIAGNOSIS — I442 Atrioventricular block, complete: Secondary | ICD-10-CM

## 2011-12-04 DIAGNOSIS — I779 Disorder of arteries and arterioles, unspecified: Secondary | ICD-10-CM | POA: Diagnosis not present

## 2011-12-04 DIAGNOSIS — R5381 Other malaise: Secondary | ICD-10-CM

## 2011-12-04 DIAGNOSIS — Z950 Presence of cardiac pacemaker: Secondary | ICD-10-CM

## 2011-12-04 DIAGNOSIS — R5383 Other fatigue: Secondary | ICD-10-CM

## 2011-12-04 LAB — PACEMAKER DEVICE OBSERVATION
AL AMPLITUDE: 1.6 mv
AL THRESHOLD: 1.5 V
BAMS-0001: 180 {beats}/min
BATTERY VOLTAGE: 2.9478 V
DEVICE MODEL PM: 2270306
RV LEAD AMPLITUDE: 12 mv
RV LEAD THRESHOLD: 0.75 V

## 2011-12-04 LAB — LDL CHOLESTEROL, DIRECT: Direct LDL: 64.2 mg/dL

## 2011-12-04 LAB — LIPID PANEL
HDL: 48.9 mg/dL (ref >39.00)
Total CHOL/HDL Ratio: 3

## 2011-12-04 NOTE — Assessment & Plan Note (Signed)
We will recheck her lipid profile today as it has been sometime since his last done

## 2011-12-04 NOTE — Assessment & Plan Note (Signed)
The patient's device was interrogated.  The information was reviewed. No changes were made in the programming.    

## 2011-12-04 NOTE — Assessment & Plan Note (Signed)
Stable post pacing 

## 2011-12-04 NOTE — Progress Notes (Signed)
HPI  [name] [name] is a 78 y.o. female seen in followup for ischemic heart disease with a modest depression of LV function. She is status post bypass surgery in 2010 and catheterization in 2007. She is near normal left ventricular function.  She has a history of complete heart block and is status post pacemaker implantation . She also has a history of atrial tachycardia   Her complaints include orthostatic intolerance particularly in the mornings. She is also concerned about her statin therapy.  She has no significant symptoms of chest pain or shortness of breath; there has been no edema          Past Medical History  Diagnosis Date  . CAD (coronary artery disease)   . LV dysfunction     intercurrent normalization of lv function- cath 2007  . Mitral regurgitation     moderate  . Syncope   . Heart block   . Pacemaker     BiV (s/p)  . Hyperlipidemia   . Osteoarthritis   . Decreased hearing     left ear  . Hyperkalemia   . Macular degeneration     impaired vision  . Anxiety      on med bid since 2007  . Carotid arterial disease     sp cae     Past Surgical History  Procedure Date  . Coronary angioplasty 2007  . Abdominal hysterectomy   . Pacemaker placement 1995 and replaced April 2010  . Coronary artery bypass graft 1995    x4  . Carotid endarterectomy 1995  . Rt knee surgery 05/2009    [facility]    Current Outpatient Prescriptions  Medication Sig Dispense Refill  . acetaminophen (TYLENOL) 325 MG tablet Take 650 mg by mouth every 6 (six) hours as needed.        . ALPRAZolam (XANAX) 0.25 MG tablet 0.5 mg at bedtime. .       . aspirin 81 MG chewable tablet Chew 81 mg by mouth daily.        Marland Kitchen atorvastatin (LIPITOR) 80 MG tablet Take 0.5 tablets (40 mg total) by mouth daily.  45 tablet  0  . carvedilol (COREG) 12.5 MG tablet Take 1 tablet (12.5 mg total) by mouth 2 (two) times daily with a meal.  180 tablet  0  . Cholecalciferol (VITAMIN D) 1000 UNITS capsule Take  1,000 Units by mouth daily.        . furosemide (LASIX) 20 MG tablet TAKE 1 TABLET BY MOUTH EVERY DAY  30 tablet  4  . isosorbide mononitrate (IMDUR) 30 MG 24 hr tablet TAKE 1/2 TABLET EVERY DAY  30 tablet  5  . meclizine (ANTIVERT) 12.5 MG tablet Take 12.5 mg by mouth daily as needed.        . Melatonin 3 MG TABS Take 3 mg by mouth at bedtime.        . Multiple Vitamins-Minerals (CENTRUM SILVER PO) Take by mouth.        . Multiple Vitamins-Minerals (ICAPS PO) Take by mouth.        . sertraline (ZOLOFT) 25 MG tablet Take 37.5 mg by mouth daily. 1 1/2 po daily      . DISCONTD: furosemide (LASIX) 20 MG tablet TAKE 1 TABLET BY MOUTH EVERY DAY  30 tablet  4    Allergies  Allergen Reactions  . Hydrocodone   . Ibuprofen     Review of Systems negative except from HPI and PMH  Physical Exam  BP 98/56  Pulse 77  Ht 5\' 2"  (1.575 m)  Wt 150 lb (68.04 kg)  BMI 27.44 kg/m2 Well developed and well nourished in no acute distress HENT normal E scleral and icterus clear Neck Supple JVP flat; carotids brisk and full Clear to ausculation Regular rate and rhythm, no murmurs gallops or rub Soft with active bowel sounds No clubbing cyanosis none Edema Alert and oriented, grossly normal motor and sensory function Skin Warm and Dry    Assessment and  Plan

## 2011-12-04 NOTE — Assessment & Plan Note (Signed)
She continues to have orthostatic intolerance. We'll decrease her carvedilol 12.5-6.25. I've also asked that she put 6 inch blocks underneath the head of her bed which may help with some of her orthostatic intolerance noted mostly in the morning

## 2011-12-04 NOTE — Assessment & Plan Note (Signed)
I'm hoping that we can help with this by decreasing her carvedilol

## 2011-12-04 NOTE — Assessment & Plan Note (Signed)
Stable on current medications 

## 2011-12-04 NOTE — Patient Instructions (Addendum)
Remote monitoring is used to monitor your Pacemaker of ICD from home. This monitoring reduces the number of office visits required to check your device to one time per year. It allows [place] to keep an eye on the functioning of your device to ensure it is working properly. You are scheduled for a device check from home on 03/09/12. You may send your transmission at any time that day. If you have a wireless device, the transmission will be sent automatically. After your physician reviews your transmission, you will receive a postcard with your next transmission date.  Your physician wants you to follow-up in: 1 year with Dr. Logan Bores will receive a reminder letter in the mail two months in advance. If you don't receive a letter, please call our office to schedule the follow-up appointment.  Your physician recommends that you have lab work today: lipid/direct LDL  Your physician has recommended you make the following change in your medication:  1) Decrease coreg (carvedilol) 12.5 mg 1/2 tablet by mouth twice daily.

## 2012-01-06 ENCOUNTER — Telehealth: Payer: Self-pay | Admitting: Internal Medicine

## 2012-01-06 NOTE — Telephone Encounter (Signed)
I spoke with the patient. She states that yesterday she was in Sunday School on the fourth floor of her church. She states it was very hot in the room she was in and she took her jacket off and pulled out her fan as there was no fan in the room. She states the people in her Sunday school class "know how she can get," so they got a PA that goes to the church to come evaluate the patient as she started not to feel well. She was described by the PA, in a e-mail she read to me that was sent to her daughter, as clammy and staring. The PA had her lay down and her BP was 140/70 with a regular pulse of 70. She sat up and her BP was 120/70 with a regular pulse of 70. Her color did begin to improve. She did not pass out. She had no vomiting. She reports not being in the sun for any lengthy period over the previous few days. Per the patient, she does not evaluate her BP at home. She states she has been feeling well up until her episode yesterday. I explained to the patient, that the heat most likely had a lot to do with how she was feeling yesterday. I encouraged her to take in some extra fluids over the next couple of days and to monitor her symptoms. I advised we do not need to make a change in her medications at this point as this was an isolated event. She verbalizes understanding and is agreeable. She will call back with any further symptoms.

## 2012-01-06 NOTE — Telephone Encounter (Signed)
New problem:  Patient calling at church on Sunday in Sunday school C/O feeling hot, sweaty,  nurse from church  Had a difficult time finding a radial pulse B/p laying down 140/70, pulse 70. Sitting 120/70, pulse 70. Asking to be seen today.

## 2012-01-08 ENCOUNTER — Telehealth: Payer: Self-pay | Admitting: Internal Medicine

## 2012-01-08 NOTE — Telephone Encounter (Signed)
Pt said her carvedilol was reduced on last visit and has been feeling fine till this Sunday she felt dizzy while at church and now this morning. Pt has not taken her bp/p. Told her I would call back in an hour so she can locate machine. Pt agreed to plan.

## 2012-01-08 NOTE — Telephone Encounter (Signed)
Pt calling re heart med, carvedilol,  being cut in half, taking 1/2 in the am and 1/2 in the pm, because feeling dizzy, for about a month, today feels tired, woozy, and shaky  pls advise

## 2012-01-08 NOTE — Telephone Encounter (Signed)
Called pt and @ 8:55 am  bp 130/66 p 83, carvedilol and lasix taken at 7:30 am, pt states not dizzy currently. Asked her to get bp/p 2 x daily and if or when she has dizziness. Advised to stay hydrated. Advised to call back with update of readings. Told her that dizziness may be from low bp/ inner ear problem and may need to see PCP. Pt states that her ears have been plugged. Pt agreed to plan.

## 2012-01-10 NOTE — Telephone Encounter (Signed)
Routing to MD as FYI.

## 2012-01-15 ENCOUNTER — Other Ambulatory Visit: Payer: Self-pay | Admitting: Internal Medicine

## 2012-02-07 DIAGNOSIS — M2040 Other hammer toe(s) (acquired), unspecified foot: Secondary | ICD-10-CM | POA: Diagnosis not present

## 2012-02-07 DIAGNOSIS — M2010 Hallux valgus (acquired), unspecified foot: Secondary | ICD-10-CM | POA: Diagnosis not present

## 2012-03-06 DIAGNOSIS — M76899 Other specified enthesopathies of unspecified lower limb, excluding foot: Secondary | ICD-10-CM | POA: Diagnosis not present

## 2012-03-06 DIAGNOSIS — Z23 Encounter for immunization: Secondary | ICD-10-CM | POA: Diagnosis not present

## 2012-03-09 ENCOUNTER — Ambulatory Visit (INDEPENDENT_AMBULATORY_CARE_PROVIDER_SITE_OTHER): Payer: Medicare Other | Admitting: *Deleted

## 2012-03-09 DIAGNOSIS — I442 Atrioventricular block, complete: Secondary | ICD-10-CM

## 2012-03-09 DIAGNOSIS — Z950 Presence of cardiac pacemaker: Secondary | ICD-10-CM | POA: Diagnosis not present

## 2012-03-10 DIAGNOSIS — M6580 Other synovitis and tenosynovitis, unspecified site: Secondary | ICD-10-CM | POA: Diagnosis not present

## 2012-03-10 LAB — REMOTE PACEMAKER DEVICE
ATRIAL PACING PM: 1
BAMS-0003: 70 {beats}/min
BRDY-0003RV: 130 {beats}/min
BRDY-0004RV: 130 {beats}/min
DEVICE MODEL PM: 2270306
RV LEAD THRESHOLD: 0.625 V

## 2012-03-17 DIAGNOSIS — H8110 Benign paroxysmal vertigo, unspecified ear: Secondary | ICD-10-CM | POA: Diagnosis not present

## 2012-03-17 DIAGNOSIS — H905 Unspecified sensorineural hearing loss: Secondary | ICD-10-CM | POA: Diagnosis not present

## 2012-03-25 DIAGNOSIS — H8110 Benign paroxysmal vertigo, unspecified ear: Secondary | ICD-10-CM | POA: Diagnosis not present

## 2012-03-25 DIAGNOSIS — H905 Unspecified sensorineural hearing loss: Secondary | ICD-10-CM | POA: Diagnosis not present

## 2012-04-07 DIAGNOSIS — R5383 Other fatigue: Secondary | ICD-10-CM | POA: Diagnosis not present

## 2012-04-13 ENCOUNTER — Other Ambulatory Visit: Payer: Self-pay

## 2012-04-13 DIAGNOSIS — I495 Sick sinus syndrome: Secondary | ICD-10-CM

## 2012-04-13 LAB — REMOTE PACEMAKER DEVICE
AL AMPLITUDE: 1.1 mv
AL IMPEDENCE PM: 380 Ohm
BAMS-0001: 150 {beats}/min
BAMS-0003: 70 {beats}/min
BRDY-0003RV: 120 {beats}/min
DEVICE MODEL PM: 2503838
LV LEAD IMPEDENCE PM: 800 Ohm
RV LEAD THRESHOLD: 0.625 V

## 2012-04-26 ENCOUNTER — Other Ambulatory Visit: Payer: Self-pay | Admitting: Cardiovascular Disease

## 2012-05-04 DIAGNOSIS — E7800 Pure hypercholesterolemia, unspecified: Secondary | ICD-10-CM | POA: Diagnosis not present

## 2012-05-04 DIAGNOSIS — Z639 Problem related to primary support group, unspecified: Secondary | ICD-10-CM | POA: Diagnosis not present

## 2012-05-04 DIAGNOSIS — R5383 Other fatigue: Secondary | ICD-10-CM | POA: Diagnosis not present

## 2012-05-04 DIAGNOSIS — R5381 Other malaise: Secondary | ICD-10-CM | POA: Diagnosis not present

## 2012-05-04 DIAGNOSIS — G479 Sleep disorder, unspecified: Secondary | ICD-10-CM | POA: Diagnosis not present

## 2012-05-04 DIAGNOSIS — M542 Cervicalgia: Secondary | ICD-10-CM | POA: Diagnosis not present

## 2012-05-19 ENCOUNTER — Other Ambulatory Visit: Payer: Self-pay | Admitting: Internal Medicine

## 2012-06-09 ENCOUNTER — Encounter: Payer: Self-pay | Admitting: *Deleted

## 2012-06-11 ENCOUNTER — Encounter: Payer: Self-pay | Admitting: Internal Medicine

## 2012-06-15 ENCOUNTER — Encounter: Payer: Self-pay | Admitting: Internal Medicine

## 2012-06-15 ENCOUNTER — Ambulatory Visit (INDEPENDENT_AMBULATORY_CARE_PROVIDER_SITE_OTHER): Payer: Medicare Other | Admitting: *Deleted

## 2012-06-15 DIAGNOSIS — Z950 Presence of cardiac pacemaker: Secondary | ICD-10-CM | POA: Diagnosis not present

## 2012-06-15 DIAGNOSIS — I442 Atrioventricular block, complete: Secondary | ICD-10-CM | POA: Diagnosis not present

## 2012-06-18 LAB — REMOTE PACEMAKER DEVICE
AL AMPLITUDE: 1.8 mv
AL IMPEDENCE PM: 340 Ohm
BAMS-0001: 180 {beats}/min
BAMS-0003: 70 {beats}/min
RV LEAD AMPLITUDE: 12 mv
RV LEAD IMPEDENCE PM: 610 Ohm
RV LEAD THRESHOLD: 0.75 V

## 2012-06-25 ENCOUNTER — Other Ambulatory Visit: Payer: Self-pay | Admitting: Internal Medicine

## 2012-06-30 ENCOUNTER — Encounter: Payer: Self-pay | Admitting: *Deleted

## 2012-06-30 ENCOUNTER — Encounter (INDEPENDENT_AMBULATORY_CARE_PROVIDER_SITE_OTHER): Payer: Medicare Other

## 2012-06-30 DIAGNOSIS — R42 Dizziness and giddiness: Secondary | ICD-10-CM

## 2012-06-30 DIAGNOSIS — I6529 Occlusion and stenosis of unspecified carotid artery: Secondary | ICD-10-CM | POA: Diagnosis not present

## 2012-07-06 DIAGNOSIS — H905 Unspecified sensorineural hearing loss: Secondary | ICD-10-CM | POA: Diagnosis not present

## 2012-07-06 DIAGNOSIS — H8110 Benign paroxysmal vertigo, unspecified ear: Secondary | ICD-10-CM | POA: Diagnosis not present

## 2012-07-10 DIAGNOSIS — Z0000 Encounter for general adult medical examination without abnormal findings: Secondary | ICD-10-CM | POA: Diagnosis not present

## 2012-07-10 DIAGNOSIS — Z23 Encounter for immunization: Secondary | ICD-10-CM | POA: Diagnosis not present

## 2012-07-10 DIAGNOSIS — B372 Candidiasis of skin and nail: Secondary | ICD-10-CM | POA: Diagnosis not present

## 2012-08-26 ENCOUNTER — Telehealth: Payer: Self-pay | Admitting: Internal Medicine

## 2012-08-26 DIAGNOSIS — I10 Essential (primary) hypertension: Secondary | ICD-10-CM | POA: Diagnosis not present

## 2012-08-26 NOTE — Telephone Encounter (Signed)
Pt's daughter  Called, because  pt's BP going up and down. Pt's daughter called her PCP yesterday and PCP prescribed BP medication for yesterday only and recommended for pt to be seen in his office today. Daughter wanted to make sure that pt needed to see [name] for her BP, because pt has a pacemaker. Pt's daughter is aware that [name] likes for pt's PCP to treat BP issues. Pt's daughter verbalized understanding.

## 2012-08-26 NOTE — Telephone Encounter (Signed)
New problem   Pt BP went up last night to 194/104 daughter called PCP Dr Pete Glatter on call Dr adjusted her meds Just for last night. But was told to come in office maybe because of her pacemaker. Daughter want to let the Dr know about this incident. At 730am BP was 191/94. At 825am BP 133/75. Pt has doc appt with PCP Dr Pete Glatter at 3:30pm. Daughter want to know if her mom need to come in to see Dr Graciela Husbands today or see [facility]. Please call daughter concerning this matter

## 2012-08-29 ENCOUNTER — Encounter (HOSPITAL_COMMUNITY): Payer: Self-pay | Admitting: Emergency Medicine

## 2012-08-29 ENCOUNTER — Emergency Department (HOSPITAL_COMMUNITY)
Admission: EM | Admit: 2012-08-29 | Discharge: 2012-08-29 | Disposition: A | Discharge status: Home / Self Care | Payer: Medicare Other | Attending: Emergency Medicine | Admitting: Emergency Medicine

## 2012-08-29 ENCOUNTER — Emergency Department (HOSPITAL_COMMUNITY): Payer: Medicare Other

## 2012-08-29 DIAGNOSIS — I519 Heart disease, unspecified: Secondary | ICD-10-CM | POA: Diagnosis not present

## 2012-08-29 DIAGNOSIS — Z950 Presence of cardiac pacemaker: Secondary | ICD-10-CM | POA: Diagnosis not present

## 2012-08-29 DIAGNOSIS — I2510 Atherosclerotic heart disease of native coronary artery without angina pectoris: Secondary | ICD-10-CM | POA: Insufficient documentation

## 2012-08-29 DIAGNOSIS — M1990 Unspecified osteoarthritis, unspecified site: Secondary | ICD-10-CM | POA: Diagnosis not present

## 2012-08-29 DIAGNOSIS — E785 Hyperlipidemia, unspecified: Secondary | ICD-10-CM | POA: Diagnosis not present

## 2012-08-29 DIAGNOSIS — R011 Cardiac murmur, unspecified: Secondary | ICD-10-CM | POA: Diagnosis not present

## 2012-08-29 DIAGNOSIS — I059 Rheumatic mitral valve disease, unspecified: Secondary | ICD-10-CM | POA: Diagnosis not present

## 2012-08-29 DIAGNOSIS — R0789 Other chest pain: Secondary | ICD-10-CM | POA: Diagnosis not present

## 2012-08-29 DIAGNOSIS — Z7982 Long term (current) use of aspirin: Secondary | ICD-10-CM | POA: Insufficient documentation

## 2012-08-29 DIAGNOSIS — R079 Chest pain, unspecified: Secondary | ICD-10-CM | POA: Diagnosis not present

## 2012-08-29 DIAGNOSIS — Z87891 Personal history of nicotine dependence: Secondary | ICD-10-CM | POA: Diagnosis not present

## 2012-08-29 DIAGNOSIS — Z9861 Coronary angioplasty status: Secondary | ICD-10-CM | POA: Insufficient documentation

## 2012-08-29 DIAGNOSIS — I779 Disorder of arteries and arterioles, unspecified: Secondary | ICD-10-CM | POA: Diagnosis not present

## 2012-08-29 DIAGNOSIS — F411 Generalized anxiety disorder: Secondary | ICD-10-CM | POA: Insufficient documentation

## 2012-08-29 DIAGNOSIS — I459 Conduction disorder, unspecified: Secondary | ICD-10-CM | POA: Insufficient documentation

## 2012-08-29 DIAGNOSIS — Z79899 Other long term (current) drug therapy: Secondary | ICD-10-CM | POA: Diagnosis not present

## 2012-08-29 DIAGNOSIS — H9190 Unspecified hearing loss, unspecified ear: Secondary | ICD-10-CM | POA: Diagnosis not present

## 2012-08-29 DIAGNOSIS — J438 Other emphysema: Secondary | ICD-10-CM | POA: Diagnosis not present

## 2012-08-29 DIAGNOSIS — Z8669 Personal history of other diseases of the nervous system and sense organs: Secondary | ICD-10-CM | POA: Diagnosis not present

## 2012-08-29 LAB — CBC
Hemoglobin: 13 g/dL (ref 12.0–15.0)
MCH: 29.9 pg (ref 26.0–34.0)
MCV: 89.4 fL (ref 78.0–100.0)
Platelets: 285 10*3/uL (ref 150–400)
RBC: 4.35 MIL/uL (ref 3.87–5.11)

## 2012-08-29 LAB — COMPREHENSIVE METABOLIC PANEL
CO2: 31 mEq/L (ref 19–32)
Calcium: 9.5 mg/dL (ref 8.4–10.5)
Creatinine, Ser: 0.81 mg/dL (ref 0.50–1.10)
GFR calc Af Amer: 72 mL/min — ABNORMAL LOW (ref >90)
GFR calc non Af Amer: 63 mL/min — ABNORMAL LOW (ref >90)
Glucose, Bld: 101 mg/dL — ABNORMAL HIGH (ref 70–99)

## 2012-08-29 LAB — POCT I-STAT TROPONIN I

## 2012-08-29 MED ORDER — OMEPRAZOLE 20 MG PO CPDR
20.0000 mg | DELAYED_RELEASE_CAPSULE | Freq: Every day | ORAL | Status: AC
Start: 2012-08-29 — End: ?

## 2012-08-29 NOTE — ED Provider Notes (Signed)
History     CSN: [id number]  Arrival date & time 08/29/12  1001   First MD Initiated Contact with Patient 08/29/12 1110      Chief Complaint  Patient presents with  . Chest Pain    HPI The patient reports developing 10 minutes of central chest discomfort that radiated to the right shoulder today.  She had no diaphoresis.  She denies nausea vomiting.  She was resting in a reclined position after breakfast when this occurred.  No bad taste in her mouth.  No nausea or vomiting.  No shortness of breath.  The symptoms lasted 10 minutes and then resolved.  She did not have time to try anything at home.  She does have a history of known coronary artery disease status post CABG.  She has a pacemaker in place for some complete heart block.  She's followed closely by the cardiology team.  She denies ever having discomfort like this before.  She otherwise has a normal morning.  She feels normal at this time.  She walked in to the emergency department from the parking lot and she did not develop any chest discomfort or shortness of breath.  She is completely asymptomatic at this time.  No recent fevers or chills.  No cough or congestion.  No abdominal pain.  No other complaints.  She would like to go home.   Past Medical History  Diagnosis Date  . CAD (coronary artery disease)   . LV dysfunction     intercurrent normalization of lv function- cath 2007  . Mitral regurgitation     moderate  . Syncope   . Heart block   . Pacemaker     BiV (s/p)  . Hyperlipidemia   . Osteoarthritis   . Decreased hearing     left ear  . Hyperkalemia   . Macular degeneration     impaired vision  . Anxiety      on med bid since 2007  . Carotid arterial disease     sp cae     Past Surgical History  Procedure Laterality Date  . Coronary angioplasty  2007  . Abdominal hysterectomy    . Pacemaker placement  1995 and replaced April 2010  . Coronary artery bypass graft  1995    x4  . Carotid endarterectomy   1995  . Rt knee surgery  05/2009    [facility]    Family History  Problem Relation Age of Onset  . Melanoma Daughter   . Other Mother     cerebral hemorrage  . Other Father     grangerene in one leg    History  Substance Use Topics  . Smoking status: Former Games developer  . Smokeless tobacco: Not on file     Comment: pt stopped a long time ago  . Alcohol Use: Yes    OB History   Grav Para Term Preterm Abortions TAB SAB Ect Mult Living                  Review of Systems  All other systems reviewed and are negative.    Allergies  Hydrocodone and Ibuprofen  Home Medications   Current Outpatient Rx  Name  Route  Sig  Dispense  Refill  . acetaminophen (TYLENOL) 325 MG tablet   Oral   Take 650 mg by mouth every 6 (six) hours as needed.           . ALPRAZolam (XANAX) 0.25 MG tablet  0.5 mg at bedtime. .          . aspirin 81 MG chewable tablet   Oral   Chew 81 mg by mouth daily.           Marland Kitchen atorvastatin (LIPITOR) 80 MG tablet   Oral   Take 0.5 tablets (40 mg total) by mouth daily.   45 tablet   0   . atorvastatin (LIPITOR) 80 MG tablet   Oral   Take 0.5 tablets (40 mg total) by mouth daily.   30 tablet   6   . carvedilol (COREG) 12.5 MG tablet      Take 1/2 tablet by mouth twice daily.         . carvedilol (COREG) 12.5 MG tablet      Take 1/2 tablet by mouth 2 times daily with a meal.   60 tablet   3   . carvedilol (COREG) 12.5 MG tablet      TAKE 1 TABLET BY MOUTH TWICE DAILY WITH A MEAL   60 tablet   3   . Cholecalciferol (VITAMIN D) 1000 UNITS capsule   Oral   Take 1,000 Units by mouth daily.           . furosemide (LASIX) 20 MG tablet      TAKE 1 TABLET BY MOUTH EVERY DAY   30 tablet   4   . furosemide (LASIX) 20 MG tablet      TAKE 1 TABLET BY MOUTH EVERY DAY   30 tablet   4   . isosorbide mononitrate (IMDUR) 30 MG 24 hr tablet      TAKE 1/2 TABLET EVERY DAY   30 tablet   5   . meclizine (ANTIVERT) 12.5 MG tablet    Oral   Take 12.5 mg by mouth daily as needed.           . Melatonin 3 MG TABS   Oral   Take 3 mg by mouth at bedtime.           . Multiple Vitamins-Minerals (CENTRUM SILVER PO)   Oral   Take by mouth.           . Multiple Vitamins-Minerals (ICAPS PO)   Oral   Take by mouth.           . sertraline (ZOLOFT) 25 MG tablet   Oral   Take 37.5 mg by mouth daily. 1 1/2 po daily           BP 168/75  Pulse 78  Temp(Src) 98.3 F (36.8 C) (Oral)  Resp 16  SpO2 95%  Physical Exam  Nursing note and vitals reviewed. Constitutional: She is oriented to person, place, and time. She appears well-developed and well-nourished. No distress.  HENT:  Head: Normocephalic and atraumatic.  Eyes: EOM are normal.  Neck: Normal range of motion.  Cardiovascular: Normal rate and regular rhythm.   Murmur heard. Pulmonary/Chest: Effort normal and breath sounds normal.  Abdominal: Soft. She exhibits no distension. There is no tenderness.  Musculoskeletal: Normal range of motion.  Neurological: She is alert and oriented to person, place, and time.  Skin: Skin is warm and dry.  Psychiatric: She has a normal mood and affect. Judgment normal.    ED Course  Procedures (including critical care time)   Date: 08/29/2012  Rate: 78  Rhythm: Atrial sensed and ventricular paced rhythm  QRS Axis: normal  Intervals: normal  ST/T Wave abnormalities: normal  Conduction Disutrbances: Left bundle  branch block  Narrative Interpretation:   Old EKG Reviewed: No significant changes noted     Labs Reviewed  COMPREHENSIVE METABOLIC PANEL - Abnormal; Notable for the following:    Glucose, Bld 101 (*)    GFR calc non Af Amer 63 (*)    GFR calc Af Amer 72 (*)    All other components within normal limits  CBC  POCT I-STAT TROPONIN I   Dg Chest 2 View  08/29/2012  *RADIOLOGY REPORT*  Clinical Data: Chest pain extending down the right arm.  CHEST - 2 VIEW  Comparison: 09/14/2008  Findings: Dual lead  pacer remains in place.  Prior CABG.  No cardiomegaly.  Thoracic aortic atherosclerotic calcification noted.  Epicardial adipose tissue noted along the left heart border.  This is similar to prior.  Large lung volumes and tapered peripheral pulmonary vasculature favor emphysema.  IMPRESSION:  1.  Emphysema.  No acute findings.   Original Report Authenticated By: Gaylyn Rong, M.D.    I personally reviewed the imaging tests through PACS system I reviewed available ER/hospitalization records through the EMR    No diagnosis found.    MDM  Her symptoms sound like they may represent more of a GI related issue.  Her discomfort began after breakfast when lying in a reclined position.  She had no associated symptoms.  Her EKG is unchanged.  Her labs and chest x-ray are normal.  She has had no recurrence of her discomfort in the past 3 hours.  She has good family relationships.  She has a cardiologist.  She'll call her cardiologist as an outpatient.  The patient be placed on Prilosec.  Have asked the patient return to the emergency department at any time for any new or worsening symptoms.  Her last for her to return for recurrent chest pain.  At this time I do not think she needs additional evaluation in the emergency department         Lyanne Co, MD 08/29/12 1159

## 2012-08-29 NOTE — ED Notes (Signed)
Onset today developed chest pain radiating to right upper extremity lasting for 15 minutes. States developed while sitting down in a recliner and resolved while still sitting. Currently pain 0/10.

## 2012-08-31 ENCOUNTER — Telehealth: Payer: Self-pay | Admitting: Internal Medicine

## 2012-08-31 NOTE — Telephone Encounter (Signed)
Spoke with patient who states she had an episode of chest pain on Saturday 3/29 after breakfast.  Patient's daughter took her to [facility] ED and patient says all she knows is they prescribed Omeprazole which she has not yet filled.  Patient states she will get that filled today but would like to see [name] as soon as possible.  Patient informed that [name] is not in office today (Mon) and that message will be routed to him but his schedule is full for Tuesday and Wed.  Patient verbalized understanding of plan.

## 2012-08-31 NOTE — Telephone Encounter (Signed)
New Problem:    Patient called in because she was seen in the ED over the weekend and was instructed to schedule and appointment to see [name] as soon as possible.  Please call back anytime after 10:30am.

## 2012-09-01 NOTE — Telephone Encounter (Signed)
I spoke with the patient. She did start her omeprazole today. She has had no other symptoms of chest pain since leaving the ER this weekend. I explained the first available appointment I have for [name] is on 4/17. She is agreeable with this and will see him at 9:30 am. I have advised her to call back should her symptoms worsen. She is agreeable.

## 2012-09-16 DIAGNOSIS — I10 Essential (primary) hypertension: Secondary | ICD-10-CM | POA: Diagnosis not present

## 2012-09-16 DIAGNOSIS — Z79899 Other long term (current) drug therapy: Secondary | ICD-10-CM | POA: Diagnosis not present

## 2012-09-16 DIAGNOSIS — F329 Major depressive disorder, single episode, unspecified: Secondary | ICD-10-CM | POA: Diagnosis not present

## 2012-09-17 ENCOUNTER — Encounter: Payer: Self-pay | Admitting: Internal Medicine

## 2012-09-17 ENCOUNTER — Ambulatory Visit (INDEPENDENT_AMBULATORY_CARE_PROVIDER_SITE_OTHER): Payer: Medicare Other | Admitting: Internal Medicine

## 2012-09-17 VITALS — BP 120/69 | HR 73 | Ht 61.0 in | Wt 141.4 lb

## 2012-09-17 DIAGNOSIS — I442 Atrioventricular block, complete: Secondary | ICD-10-CM

## 2012-09-17 DIAGNOSIS — Z950 Presence of cardiac pacemaker: Secondary | ICD-10-CM | POA: Diagnosis not present

## 2012-09-17 DIAGNOSIS — I2510 Atherosclerotic heart disease of native coronary artery without angina pectoris: Secondary | ICD-10-CM

## 2012-09-17 LAB — PACEMAKER DEVICE OBSERVATION
AL IMPEDENCE PM: 375 Ohm
AL THRESHOLD: 1.25 V
RV LEAD AMPLITUDE: 10.4 mv
RV LEAD IMPEDENCE PM: 625 Ohm

## 2012-09-17 NOTE — Assessment & Plan Note (Signed)
The patient's device was interrogated.  The information was reviewed. No changes were made in the programming.    

## 2012-09-17 NOTE — Assessment & Plan Note (Signed)
Stable on current meds 

## 2012-09-17 NOTE — Progress Notes (Signed)
[name] [name] Patient Care Team: Hal Stormy Fabian, MD as PCP - General (Internal Medicine) Duke Salvia, MD (Cardiology) Dannielle Huh, MD (Orthopedic Surgery)   HPI  [name] [name] is a 79 y.o. female seen in followup for ischemic heart disease with a modest depression of LV function. She is status post bypass surgery in 2002 and catheterization in 2007 demonstrating three out of four grafts patent; ejection fraction at that time was normal.  Echocardiogram 2012 demonstrated normal left ventricular function with very mild aortic stenosis  She has a history of complete heart block and is status post pacemaker implantation . She also has a history of atrial tachycardia    The patient denies chest pain, shortness of breath, nocturnal dyspnea, orthopnea or peripheral edema.  There have been no palpitations, lightheadedness or syncope.     Past Medical History  Diagnosis Date  . CAD (coronary artery disease)   . LV dysfunction     intercurrent normalization of lv function- cath 2007  . Mitral regurgitation     moderate  . Syncope   . Heart block   . Pacemaker     BiV (s/p)  . Hyperlipidemia   . Osteoarthritis   . Decreased hearing     left ear  . Hyperkalemia   . Macular degeneration     impaired vision  . Anxiety      on med bid since 2007  . Carotid arterial disease     sp cae     Past Surgical History  Procedure Laterality Date  . Coronary angioplasty  2007  . Abdominal hysterectomy    . Pacemaker placement  1995 and replaced April 2010  . Coronary artery bypass graft  1995    x4  . Carotid endarterectomy  1995  . Rt knee surgery  05/2009    [facility]    Current Outpatient Prescriptions  Medication Sig Dispense Refill  . acetaminophen (TYLENOL) 325 MG tablet Take 650 mg by mouth every 6 (six) hours as needed.        . ALPRAZolam (XANAX) 0.25 MG tablet 0.5 mg at bedtime. .       . amLODipine (NORVASC) 2.5 MG tablet Take 2.5 mg by mouth daily.      Marland Kitchen aspirin 81 MG  chewable tablet Chew 81 mg by mouth daily.        Marland Kitchen atorvastatin (LIPITOR) 80 MG tablet Take 0.5 tablets (40 mg total) by mouth daily.  30 tablet  6  . carvedilol (COREG) 12.5 MG tablet Take 12.5 mg by mouth. Take 2  tablet by mouth twice daily.      . Cholecalciferol (VITAMIN D) 1000 UNITS capsule Take 1,000 Units by mouth daily.        . furosemide (LASIX) 20 MG tablet TAKE 1 TABLET BY MOUTH EVERY DAY  30 tablet  4  . isosorbide mononitrate (IMDUR) 30 MG 24 hr tablet TAKE 1/2 TABLET EVERY DAY  30 tablet  5  . Melatonin 3 MG TABS Take 3 mg by mouth at bedtime.        . Multiple Vitamins-Minerals (CENTRUM SILVER PO) Take by mouth.        . Multiple Vitamins-Minerals (ICAPS PO) Take by mouth.        Marland Kitchen omeprazole (PRILOSEC) 20 MG capsule Take 1 capsule (20 mg total) by mouth daily.  30 capsule  0  . sertraline (ZOLOFT) 25 MG tablet Take 37.5 mg by mouth daily. 1 1/2 po daily      .  traMADol (ULTRAM) 50 MG tablet Take 50 mg by mouth as needed for pain.       No current facility-administered medications for this visit.    Allergies  Allergen Reactions  . Hydrocodone   . Ibuprofen     Review of Systems negative except from HPI and PMH  Physical Exam BP 120/69  Pulse 73  Ht 5\' 1"  (1.549 m)  Wt 141 lb 6.4 oz (64.139 kg)  BMI 26.73 kg/m2 Well developed and nourished in no acute distress HENT normal Neck supple with JVP-flat Clear Regular rate and rhythm, 2/6 murmur Abd-soft with active BS No Clubbing cyanosis edema Skin-warm and dry A & Oriented  Grossly normal sensory and motor function Device pocket well healed; without hematoma or erythema     Assessment and  Plan

## 2012-09-17 NOTE — Assessment & Plan Note (Signed)
Stable post  Pacing

## 2012-09-17 NOTE — Patient Instructions (Signed)
Remote monitoring is used to monitor your Pacemaker of ICD from home. This monitoring reduces the number of office visits required to check your device to one time per year. It allows [place] to keep an eye on the functioning of your device to ensure it is working properly. You are scheduled for a device check from home on 12/21/12. You may send your transmission at any time that day. If you have a wireless device, the transmission will be sent automatically. After your physician reviews your transmission, you will receive a postcard with your next transmission date.  Your physician wants you to follow-up in: 1 year with [name]. You will receive a reminder letter in the mail two months in advance. If you don't receive a letter, please call our office to schedule the follow-up appointment.  Your physician recommends that you continue on your current medications as directed. Please refer to the Current Medication list given to you today.

## 2012-09-25 DIAGNOSIS — L0291 Cutaneous abscess, unspecified: Secondary | ICD-10-CM | POA: Diagnosis not present

## 2012-09-27 ENCOUNTER — Other Ambulatory Visit: Payer: Self-pay | Admitting: Cardiovascular Disease

## 2012-10-15 ENCOUNTER — Other Ambulatory Visit: Payer: Self-pay | Admitting: Internal Medicine

## 2012-11-17 DIAGNOSIS — M25559 Pain in unspecified hip: Secondary | ICD-10-CM | POA: Insufficient documentation

## 2012-11-18 ENCOUNTER — Other Ambulatory Visit: Payer: Self-pay | Admitting: Internal Medicine

## 2012-12-02 ENCOUNTER — Other Ambulatory Visit: Payer: Self-pay | Admitting: Geriatric Medicine

## 2012-12-02 DIAGNOSIS — Z639 Problem related to primary support group, unspecified: Secondary | ICD-10-CM | POA: Diagnosis not present

## 2012-12-02 DIAGNOSIS — R1310 Dysphagia, unspecified: Secondary | ICD-10-CM

## 2012-12-02 DIAGNOSIS — R000 Tachycardia, unspecified: Secondary | ICD-10-CM | POA: Diagnosis not present

## 2012-12-10 ENCOUNTER — Other Ambulatory Visit: Payer: Medicare Other

## 2012-12-21 ENCOUNTER — Encounter: Payer: Medicare Other | Admitting: *Deleted

## 2012-12-22 ENCOUNTER — Encounter: Payer: Self-pay | Admitting: *Deleted

## 2012-12-24 ENCOUNTER — Ambulatory Visit
Admission: RE | Admit: 2012-12-24 | Discharge: 2012-12-24 | Disposition: A | Discharge status: Home / Self Care | Payer: Medicare Other | Source: Ambulatory Visit | Attending: Geriatric Medicine | Admitting: Geriatric Medicine

## 2012-12-24 DIAGNOSIS — R1310 Dysphagia, unspecified: Secondary | ICD-10-CM

## 2012-12-24 DIAGNOSIS — K224 Dyskinesia of esophagus: Secondary | ICD-10-CM | POA: Diagnosis not present

## 2012-12-25 ENCOUNTER — Ambulatory Visit (INDEPENDENT_AMBULATORY_CARE_PROVIDER_SITE_OTHER): Payer: Medicare Other | Admitting: *Deleted

## 2012-12-25 DIAGNOSIS — I442 Atrioventricular block, complete: Secondary | ICD-10-CM | POA: Diagnosis not present

## 2012-12-25 DIAGNOSIS — Z950 Presence of cardiac pacemaker: Secondary | ICD-10-CM | POA: Diagnosis not present

## 2012-12-30 LAB — REMOTE PACEMAKER DEVICE
BAMS-0003: 70 {beats}/min
BATTERY VOLTAGE: 2.95 V
BRDY-0002RV: 60 {beats}/min
BRDY-0003RV: 130 {beats}/min
RV LEAD AMPLITUDE: 12 mv
VENTRICULAR PACING PM: 98

## 2013-01-12 DIAGNOSIS — I10 Essential (primary) hypertension: Secondary | ICD-10-CM | POA: Diagnosis not present

## 2013-01-12 DIAGNOSIS — R269 Unspecified abnormalities of gait and mobility: Secondary | ICD-10-CM | POA: Diagnosis not present

## 2013-01-12 DIAGNOSIS — Z79899 Other long term (current) drug therapy: Secondary | ICD-10-CM | POA: Diagnosis not present

## 2013-01-12 DIAGNOSIS — K1370 Unspecified lesions of oral mucosa: Secondary | ICD-10-CM | POA: Diagnosis not present

## 2013-01-12 DIAGNOSIS — R7301 Impaired fasting glucose: Secondary | ICD-10-CM | POA: Diagnosis not present

## 2013-01-12 DIAGNOSIS — R5383 Other fatigue: Secondary | ICD-10-CM | POA: Diagnosis not present

## 2013-01-12 DIAGNOSIS — M25559 Pain in unspecified hip: Secondary | ICD-10-CM | POA: Diagnosis not present

## 2013-01-14 DIAGNOSIS — R7301 Impaired fasting glucose: Secondary | ICD-10-CM | POA: Diagnosis not present

## 2013-01-29 DIAGNOSIS — R61 Generalized hyperhidrosis: Secondary | ICD-10-CM | POA: Diagnosis not present

## 2013-01-29 DIAGNOSIS — R350 Frequency of micturition: Secondary | ICD-10-CM | POA: Diagnosis not present

## 2013-01-29 DIAGNOSIS — R6883 Chills (without fever): Secondary | ICD-10-CM | POA: Diagnosis not present

## 2013-02-02 ENCOUNTER — Encounter: Payer: Self-pay | Admitting: *Deleted

## 2013-02-02 DIAGNOSIS — M549 Dorsalgia, unspecified: Secondary | ICD-10-CM | POA: Diagnosis not present

## 2013-02-02 DIAGNOSIS — F329 Major depressive disorder, single episode, unspecified: Secondary | ICD-10-CM | POA: Diagnosis not present

## 2013-02-02 DIAGNOSIS — R109 Unspecified abdominal pain: Secondary | ICD-10-CM | POA: Diagnosis not present

## 2013-02-09 DIAGNOSIS — M545 Low back pain: Secondary | ICD-10-CM | POA: Diagnosis not present

## 2013-02-09 DIAGNOSIS — M25559 Pain in unspecified hip: Secondary | ICD-10-CM | POA: Diagnosis not present

## 2013-02-09 DIAGNOSIS — M47817 Spondylosis without myelopathy or radiculopathy, lumbosacral region: Secondary | ICD-10-CM | POA: Diagnosis not present

## 2013-02-11 ENCOUNTER — Telehealth: Payer: Self-pay | Admitting: Internal Medicine

## 2013-02-11 NOTE — Telephone Encounter (Signed)
New Problem  Pt states heartbeat has been erratic lately// very stressed// lost 15 lbs// wants to know if her pacemaker needs to be checked// having nervous feelings and does not feel well// request a call back to discuss

## 2013-02-11 NOTE — Telephone Encounter (Signed)
F/u pt called again and states she will be out until 3:30 and will be available after that.

## 2013-02-12 NOTE — Telephone Encounter (Signed)
Patient asking if her PPM should be checked. She states that her husband was moved to a SNF fulltime for alzheimer's - this has torn her up and left her very stressed and upset. She states that she visits him everyday but it doesn't make her feel better because she can't stay with him. She says that today she is feeling better and her heart has not been "acting up". She is agreeable for me to talk with device clinic on Monday and get back with her.

## 2013-02-15 ENCOUNTER — Telehealth: Payer: Self-pay | Admitting: *Deleted

## 2013-02-15 NOTE — Telephone Encounter (Signed)
Called Mrs. Beiser per [employer]. She said her son-in-law will come over this evening to send a manual transmission through [employer].  I will review the report in the morning then call her back.

## 2013-02-16 ENCOUNTER — Telehealth: Payer: Self-pay | Admitting: *Deleted

## 2013-02-16 NOTE — Telephone Encounter (Signed)
I reviewed [name] [name]'s Merlin transmission. All functions normal, no episodes, all diagnostics consistent with previous measurements   She expresses depression resulting from 60 yrs of marriage with her husband who is now in fulltime care for Alzheimer's.  She asked if I could see if [name] could provide her "something to make her feel better."  I said, typically, in these difficult situations it is best to contact her PCP for that type of assistance. Concerning her device, everything looks great and is functioning normal. I stated [name] would only address pacemaker related issues.  She said okay, expressed gratitude, then hung up.

## 2013-02-25 ENCOUNTER — Other Ambulatory Visit: Payer: Self-pay | Admitting: Internal Medicine

## 2013-03-01 DIAGNOSIS — I10 Essential (primary) hypertension: Secondary | ICD-10-CM | POA: Diagnosis not present

## 2013-03-01 DIAGNOSIS — F329 Major depressive disorder, single episode, unspecified: Secondary | ICD-10-CM | POA: Diagnosis not present

## 2013-03-09 ENCOUNTER — Encounter: Payer: Self-pay | Admitting: Internal Medicine

## 2013-03-25 DIAGNOSIS — Z23 Encounter for immunization: Secondary | ICD-10-CM | POA: Diagnosis not present

## 2013-03-29 ENCOUNTER — Ambulatory Visit (INDEPENDENT_AMBULATORY_CARE_PROVIDER_SITE_OTHER): Payer: Medicare Other | Admitting: *Deleted

## 2013-03-29 DIAGNOSIS — I442 Atrioventricular block, complete: Secondary | ICD-10-CM | POA: Diagnosis not present

## 2013-03-30 LAB — REMOTE PACEMAKER DEVICE
AL AMPLITUDE: 1.2 mv
BAMS-0001: 180 {beats}/min
BAMS-0003: 70 {beats}/min
DEVICE MODEL PM: 2270306
VENTRICULAR PACING PM: 96

## 2013-04-08 NOTE — Progress Notes (Signed)
Remote pacer received  

## 2013-04-09 ENCOUNTER — Encounter: Payer: Self-pay | Admitting: *Deleted

## 2013-04-19 ENCOUNTER — Encounter: Payer: Self-pay | Admitting: Internal Medicine

## 2013-05-28 ENCOUNTER — Other Ambulatory Visit: Payer: Self-pay | Admitting: Internal Medicine

## 2013-06-02 DIAGNOSIS — R413 Other amnesia: Secondary | ICD-10-CM | POA: Diagnosis not present

## 2013-06-02 DIAGNOSIS — H6120 Impacted cerumen, unspecified ear: Secondary | ICD-10-CM | POA: Diagnosis not present

## 2013-06-02 DIAGNOSIS — F329 Major depressive disorder, single episode, unspecified: Secondary | ICD-10-CM | POA: Diagnosis not present

## 2013-06-29 ENCOUNTER — Encounter: Payer: Self-pay | Admitting: Internal Medicine

## 2013-06-29 ENCOUNTER — Ambulatory Visit (INDEPENDENT_AMBULATORY_CARE_PROVIDER_SITE_OTHER): Payer: Medicare Other | Admitting: *Deleted

## 2013-06-29 DIAGNOSIS — I442 Atrioventricular block, complete: Secondary | ICD-10-CM | POA: Diagnosis not present

## 2013-06-29 DIAGNOSIS — Z950 Presence of cardiac pacemaker: Secondary | ICD-10-CM

## 2013-07-02 ENCOUNTER — Encounter (HOSPITAL_COMMUNITY): Payer: Medicare Other

## 2013-07-02 LAB — MDC_IDC_ENUM_SESS_TYPE_REMOTE
Battery Remaining Longevity: 71 mo
Battery Voltage: 2.92 V
Brady Statistic AS VS Percent: 1.9 %
Implantable Pulse Generator Model: 2110
Lead Channel Impedance Value: 360 Ohm
Lead Channel Pacing Threshold Amplitude: 0.875 V
Lead Channel Pacing Threshold Pulse Width: 0.6 ms
Lead Channel Setting Pacing Amplitude: 1.125
Lead Channel Setting Pacing Amplitude: 2.5 V
Lead Channel Setting Pacing Pulse Width: 0.6 ms
MDC IDC MSMT LEADCHNL RA PACING THRESHOLD AMPLITUDE: 1.25 V
MDC IDC MSMT LEADCHNL RA PACING THRESHOLD PULSEWIDTH: 0.8 ms
MDC IDC MSMT LEADCHNL RA SENSING INTR AMPL: 1.4 mV
MDC IDC MSMT LEADCHNL RV IMPEDANCE VALUE: 540 Ohm
MDC IDC MSMT LEADCHNL RV SENSING INTR AMPL: 12 mV
MDC IDC PG SERIAL: 2270306
MDC IDC SESS DTM: 20150128005227
MDC IDC SET LEADCHNL RV SENSING SENSITIVITY: 2 mV
MDC IDC STAT BRADY AP VP PERCENT: 1 %
MDC IDC STAT BRADY AP VS PERCENT: 1 %
MDC IDC STAT BRADY AS VP PERCENT: 96 %
MDC IDC STAT BRADY RA PERCENT PACED: 1 %
MDC IDC STAT BRADY RV PERCENT PACED: 97 %

## 2013-07-06 ENCOUNTER — Other Ambulatory Visit (HOSPITAL_COMMUNITY): Payer: Self-pay | Admitting: Cardiology

## 2013-07-06 DIAGNOSIS — I6529 Occlusion and stenosis of unspecified carotid artery: Secondary | ICD-10-CM

## 2013-07-12 DIAGNOSIS — Z0000 Encounter for general adult medical examination without abnormal findings: Secondary | ICD-10-CM | POA: Diagnosis not present

## 2013-07-12 DIAGNOSIS — E7800 Pure hypercholesterolemia, unspecified: Secondary | ICD-10-CM | POA: Diagnosis not present

## 2013-07-12 DIAGNOSIS — Z79899 Other long term (current) drug therapy: Secondary | ICD-10-CM | POA: Diagnosis not present

## 2013-07-12 DIAGNOSIS — Z1331 Encounter for screening for depression: Secondary | ICD-10-CM | POA: Diagnosis not present

## 2013-07-13 ENCOUNTER — Encounter: Payer: Self-pay | Admitting: *Deleted

## 2013-07-13 ENCOUNTER — Ambulatory Visit (HOSPITAL_COMMUNITY): Payer: Medicare Other | Attending: Internal Medicine

## 2013-07-13 DIAGNOSIS — E785 Hyperlipidemia, unspecified: Secondary | ICD-10-CM | POA: Insufficient documentation

## 2013-07-13 DIAGNOSIS — Z951 Presence of aortocoronary bypass graft: Secondary | ICD-10-CM | POA: Diagnosis not present

## 2013-07-13 DIAGNOSIS — I658 Occlusion and stenosis of other precerebral arteries: Secondary | ICD-10-CM | POA: Insufficient documentation

## 2013-07-13 DIAGNOSIS — I10 Essential (primary) hypertension: Secondary | ICD-10-CM | POA: Insufficient documentation

## 2013-07-13 DIAGNOSIS — I739 Peripheral vascular disease, unspecified: Secondary | ICD-10-CM | POA: Insufficient documentation

## 2013-07-13 DIAGNOSIS — I2510 Atherosclerotic heart disease of native coronary artery without angina pectoris: Secondary | ICD-10-CM | POA: Diagnosis not present

## 2013-07-13 DIAGNOSIS — I6529 Occlusion and stenosis of unspecified carotid artery: Secondary | ICD-10-CM | POA: Insufficient documentation

## 2013-07-13 DIAGNOSIS — Z87891 Personal history of nicotine dependence: Secondary | ICD-10-CM | POA: Insufficient documentation

## 2013-07-16 DIAGNOSIS — N750 Cyst of Bartholin's gland: Secondary | ICD-10-CM | POA: Diagnosis not present

## 2013-07-17 ENCOUNTER — Other Ambulatory Visit: Payer: Self-pay | Admitting: Internal Medicine

## 2013-07-18 ENCOUNTER — Other Ambulatory Visit: Payer: Self-pay | Admitting: Internal Medicine

## 2013-07-19 ENCOUNTER — Other Ambulatory Visit: Payer: Self-pay

## 2013-07-19 NOTE — Telephone Encounter (Signed)
Dr Caryl Comes please clarify carvedilol dose, 12.5 mg bid or 12.5 mg two tablets bid?

## 2013-07-23 ENCOUNTER — Other Ambulatory Visit: Payer: Self-pay

## 2013-07-23 DIAGNOSIS — N750 Cyst of Bartholin's gland: Secondary | ICD-10-CM | POA: Diagnosis not present

## 2013-08-06 DIAGNOSIS — N9089 Other specified noninflammatory disorders of vulva and perineum: Secondary | ICD-10-CM | POA: Diagnosis not present

## 2013-08-06 DIAGNOSIS — N750 Cyst of Bartholin's gland: Secondary | ICD-10-CM | POA: Diagnosis not present

## 2013-08-06 DIAGNOSIS — D280 Benign neoplasm of vulva: Secondary | ICD-10-CM | POA: Diagnosis not present

## 2013-08-13 DIAGNOSIS — N750 Cyst of Bartholin's gland: Secondary | ICD-10-CM | POA: Diagnosis not present

## 2013-09-21 DIAGNOSIS — M6580 Other synovitis and tenosynovitis, unspecified site: Secondary | ICD-10-CM | POA: Diagnosis not present

## 2013-09-28 ENCOUNTER — Ambulatory Visit (INDEPENDENT_AMBULATORY_CARE_PROVIDER_SITE_OTHER): Payer: Medicare Other | Admitting: Internal Medicine

## 2013-09-28 ENCOUNTER — Encounter: Payer: Self-pay | Admitting: Internal Medicine

## 2013-09-28 VITALS — BP 150/83 | HR 82 | Ht 62.0 in | Wt 138.0 lb

## 2013-09-28 DIAGNOSIS — I442 Atrioventricular block, complete: Secondary | ICD-10-CM | POA: Diagnosis not present

## 2013-09-28 DIAGNOSIS — Z950 Presence of cardiac pacemaker: Secondary | ICD-10-CM

## 2013-09-28 DIAGNOSIS — I6529 Occlusion and stenosis of unspecified carotid artery: Secondary | ICD-10-CM

## 2013-09-28 DIAGNOSIS — I259 Chronic ischemic heart disease, unspecified: Secondary | ICD-10-CM

## 2013-09-28 LAB — MDC_IDC_ENUM_SESS_TYPE_INCLINIC
Battery Remaining Longevity: 102 mo
Battery Voltage: 2.95 V
Brady Statistic RA Percent Paced: 0.43 %
Implantable Pulse Generator Model: 2110
Lead Channel Impedance Value: 337.5 Ohm
Lead Channel Pacing Threshold Amplitude: 1.25 V
Lead Channel Pacing Threshold Amplitude: 1.25 V
Lead Channel Pacing Threshold Pulse Width: 0.6 ms
Lead Channel Pacing Threshold Pulse Width: 0.8 ms
Lead Channel Sensing Intrinsic Amplitude: 3.4 mV
MDC IDC MSMT LEADCHNL RA PACING THRESHOLD PULSEWIDTH: 0.8 ms
MDC IDC MSMT LEADCHNL RV IMPEDANCE VALUE: 625 Ohm
MDC IDC MSMT LEADCHNL RV PACING THRESHOLD AMPLITUDE: 0.75 V
MDC IDC MSMT LEADCHNL RV SENSING INTR AMPL: 12 mV
MDC IDC PG SERIAL: 2270306
MDC IDC SESS DTM: 20150428163542
MDC IDC SET LEADCHNL RA PACING AMPLITUDE: 2.5 V
MDC IDC SET LEADCHNL RV PACING AMPLITUDE: 1 V
MDC IDC SET LEADCHNL RV PACING PULSEWIDTH: 0.6 ms
MDC IDC SET LEADCHNL RV SENSING SENSITIVITY: 2 mV
MDC IDC STAT BRADY RV PERCENT PACED: 98 %

## 2013-09-28 NOTE — Patient Instructions (Signed)
Your physician has requested that you have an echocardiogram. Echocardiography is a painless test that uses sound waves to create images of your heart. It provides your doctor with information about the size and shape of your heart and how well your heart's chambers and valves are working. This procedure takes approximately one hour. There are no restrictions for this procedure.  Your physician recommends that you continue on your current medications as directed. Please refer to the Current Medication list given to you today.  Remote monitoring is used to monitor your Pacemaker of ICD from home. This monitoring reduces the number of office visits required to check your device to one time per year. It allows [place] to keep an eye on the functioning of your device to ensure it is working properly. You are scheduled for a device check from home on 12/30/13. You may send your transmission at any time that day. If you have a wireless device, the transmission will be sent automatically. After your physician reviews your transmission, you will receive a postcard with your next transmission date.  Your physician wants you to follow-up in: 1 year with Dr. Caryl Comes.  You will receive a reminder letter in the mail two months in advance. If you don't receive a letter, please call our office to schedule the follow-up appointment.

## 2013-09-28 NOTE — Progress Notes (Signed)
Patient Care Team: Lajean Manes, MD as PCP - General (Internal Medicine) Deboraha Sprang, MD (Cardiology) Vickey Huger, MD (Orthopedic Surgery)   HPI  [name] [name] is a 80 y.o. female seen in followup for ischemic heart disease with a modest depression of LV function. She is status post bypass surgery in 2002 and catheterization in 2007 demonstrating three out of four grafts patent; ejection fraction at that time was normal. Echocardiogram 2012 demonstrated normal left ventricular function with very mild aortic stenosis    She has a history of complete heart block and is status post pacemaker implantation . She also has a history of atrial tachycardia    The patient denies chest pain, shortness of breath, nocturnal dyspnea, orthopnea or peripheral edema. There have been no palpitations, lightheadedness or syncope.    her husband has recently been moved to assisted living. She sees him every day at 2:00  Past Medical History  Diagnosis Date  . CAD (coronary artery disease)   . LV dysfunction     intercurrent normalization of lv function- cath 2007  . Mitral regurgitation     moderate  . Syncope   . Complete heart block   . Pacemaker -[place]     Date implanted 1996; generator replacement 2010  . Hyperlipidemia   . Osteoarthritis   . Decreased hearing     left ear  . Hyperkalemia   . Macular degeneration     impaired vision  . Anxiety      on med bid since 2007  . Carotid arterial disease     sp cae     Past Surgical History  Procedure Laterality Date  . Coronary angioplasty  2007  . Abdominal hysterectomy    . Pacemaker placement  1995 and replaced April 2010  . Coronary artery bypass graft  1995    x4  . Carotid endarterectomy  1995  . Rt knee surgery  05/2009    [facility]    Current Outpatient Prescriptions  Medication Sig Dispense Refill  . acetaminophen (TYLENOL) 325 MG tablet Take 650 mg by mouth every 6 (six) hours as needed.        Marland Kitchen  amLODipine (NORVASC) 2.5 MG tablet Take 2.5 mg by mouth daily.      Marland Kitchen aspirin 81 MG chewable tablet Chew 81 mg by mouth daily.        . carvedilol (COREG) 12.5 MG tablet TAKE 1 TABLET BY MOUTH TWICE A DAY WITH A MEAL  60 tablet  3  . Cholecalciferol (VITAMIN D) 1000 UNITS capsule Take 1,000 Units by mouth daily.        . furosemide (LASIX) 20 MG tablet TAKE 1 TABLET BY MOUTH EVERY DAY  30 tablet  4  . isosorbide mononitrate (IMDUR) 30 MG 24 hr tablet TAKE 1/2 TABLET EVERY DAY  30 tablet  5  . Melatonin 5 MG TABS Take by mouth daily.      . Multiple Vitamins-Minerals (CENTRUM SILVER PO) Take by mouth.        . Multiple Vitamins-Minerals (ICAPS PO) Take by mouth.        Marland Kitchen omeprazole (PRILOSEC) 20 MG capsule Take 1 capsule (20 mg total) by mouth daily.  30 capsule  0  . sertraline (ZOLOFT) 50 MG tablet Take 50 mg by mouth daily.      . traMADol (ULTRAM) 50 MG tablet Take 50 mg by mouth as needed for pain.      Marland Kitchen  atorvastatin (LIPITOR) 80 MG tablet Take 0.5 tablets (40 mg total) by mouth daily.  30 tablet  6   No current facility-administered medications for this visit.    Allergies  Allergen Reactions  . Hydrocodone   . Ibuprofen     Review of Systems negative except from HPI and PMH  Physical Exam BP 150/83  Pulse 82  Ht 5\' 2"  (1.575 m)  Wt 138 lb (62.596 kg)  BMI 25.23 kg/m2 Well developed and well nourished in no acute distress HENT normal E scleral and icterus clear Neck Supple JVP flat; carotids full but somewhat delayed Clear to ausculation  Regular rate and rhythm, 2/6 systolic murmur with a retained split S2 Soft with active bowel sounds No clubbing cyanosis   Edema Alert and oriented, grossly normal motor and sensory function Skin Warm and Dry   ECG  NSR with  P-synchronous/ AV  pacing  Assessment and  Plan  Complete heart block stable post pacing  Pacemaker-St. Jude The patient's device was interrogated.  The information was reviewed. No changes were made in  the programming.    Aortic stenosis  Hypertension We will check an echo for follow her aortic stenosis as it has been 2-1/2 years.  Blood pressure is reasonably controlled

## 2013-10-06 DIAGNOSIS — N750 Cyst of Bartholin's gland: Secondary | ICD-10-CM | POA: Diagnosis not present

## 2013-10-06 DIAGNOSIS — R32 Unspecified urinary incontinence: Secondary | ICD-10-CM | POA: Diagnosis not present

## 2013-10-08 ENCOUNTER — Encounter: Payer: Self-pay | Admitting: Internal Medicine

## 2013-10-11 ENCOUNTER — Ambulatory Visit (HOSPITAL_COMMUNITY): Payer: Medicare Other | Attending: Internal Medicine | Admitting: Cardiology

## 2013-10-11 DIAGNOSIS — I442 Atrioventricular block, complete: Secondary | ICD-10-CM | POA: Diagnosis not present

## 2013-10-11 DIAGNOSIS — I359 Nonrheumatic aortic valve disorder, unspecified: Secondary | ICD-10-CM

## 2013-10-11 DIAGNOSIS — I259 Chronic ischemic heart disease, unspecified: Secondary | ICD-10-CM

## 2013-10-11 DIAGNOSIS — Z950 Presence of cardiac pacemaker: Secondary | ICD-10-CM

## 2013-10-11 DIAGNOSIS — I2510 Atherosclerotic heart disease of native coronary artery without angina pectoris: Secondary | ICD-10-CM

## 2013-10-11 NOTE — Progress Notes (Signed)
Echo performed. 

## 2013-10-15 ENCOUNTER — Telehealth: Payer: Self-pay | Admitting: Internal Medicine

## 2013-10-15 NOTE — Telephone Encounter (Signed)
New message    Calling for test results  

## 2013-10-18 NOTE — Telephone Encounter (Signed)
Informed pt's dtr that Dr. Caryl Comes had not reviewed echo results yet but I would review with him tomorrow, when he is back in office, and let her know as soon as I get his findings. She is agreeable to this.

## 2013-10-20 NOTE — Telephone Encounter (Signed)
Notified echo results same, no changes, slight decrease in EF. Pt's dtr verbalized understanding.

## 2013-11-02 DIAGNOSIS — I10 Essential (primary) hypertension: Secondary | ICD-10-CM | POA: Diagnosis not present

## 2013-11-02 DIAGNOSIS — R5381 Other malaise: Secondary | ICD-10-CM | POA: Diagnosis not present

## 2013-11-02 DIAGNOSIS — R413 Other amnesia: Secondary | ICD-10-CM | POA: Diagnosis not present

## 2013-11-02 DIAGNOSIS — D539 Nutritional anemia, unspecified: Secondary | ICD-10-CM | POA: Diagnosis not present

## 2013-11-02 DIAGNOSIS — R609 Edema, unspecified: Secondary | ICD-10-CM | POA: Diagnosis not present

## 2013-12-07 DIAGNOSIS — I10 Essential (primary) hypertension: Secondary | ICD-10-CM | POA: Diagnosis not present

## 2013-12-07 DIAGNOSIS — D649 Anemia, unspecified: Secondary | ICD-10-CM | POA: Diagnosis not present

## 2013-12-17 ENCOUNTER — Other Ambulatory Visit: Payer: Self-pay | Admitting: Internal Medicine

## 2013-12-22 ENCOUNTER — Other Ambulatory Visit: Payer: Self-pay | Admitting: Internal Medicine

## 2013-12-30 ENCOUNTER — Ambulatory Visit (INDEPENDENT_AMBULATORY_CARE_PROVIDER_SITE_OTHER): Payer: Medicare Other | Admitting: *Deleted

## 2013-12-30 DIAGNOSIS — I442 Atrioventricular block, complete: Secondary | ICD-10-CM | POA: Diagnosis not present

## 2013-12-30 DIAGNOSIS — Z950 Presence of cardiac pacemaker: Secondary | ICD-10-CM | POA: Diagnosis not present

## 2013-12-30 LAB — MDC_IDC_ENUM_SESS_TYPE_REMOTE
Battery Remaining Percentage: 67 %
Brady Statistic AP VP Percent: 1 %
Brady Statistic AP VS Percent: 1 %
Brady Statistic AS VP Percent: 99 %
Brady Statistic AS VS Percent: 1 %
Brady Statistic RV Percent Paced: 99 %
Implantable Pulse Generator Model: 2110
Implantable Pulse Generator Serial Number: 2270306
Lead Channel Impedance Value: 310 Ohm
Lead Channel Pacing Threshold Amplitude: 0.75 V
Lead Channel Pacing Threshold Pulse Width: 0.6 ms
Lead Channel Pacing Threshold Pulse Width: 0.8 ms
Lead Channel Sensing Intrinsic Amplitude: 11.1 mV
Lead Channel Setting Pacing Amplitude: 2.5 V
Lead Channel Setting Pacing Pulse Width: 0.6 ms
MDC IDC MSMT BATTERY REMAINING LONGEVITY: 79 mo
MDC IDC MSMT BATTERY VOLTAGE: 2.93 V
MDC IDC MSMT LEADCHNL RA PACING THRESHOLD AMPLITUDE: 1.25 V
MDC IDC MSMT LEADCHNL RA SENSING INTR AMPL: 1.7 mV
MDC IDC MSMT LEADCHNL RV IMPEDANCE VALUE: 590 Ohm
MDC IDC SESS DTM: 20150730000231
MDC IDC SET LEADCHNL RV PACING AMPLITUDE: 1 V
MDC IDC SET LEADCHNL RV SENSING SENSITIVITY: 2 mV
MDC IDC STAT BRADY RA PERCENT PACED: 1 %

## 2014-01-07 DIAGNOSIS — R32 Unspecified urinary incontinence: Secondary | ICD-10-CM | POA: Diagnosis not present

## 2014-01-18 ENCOUNTER — Other Ambulatory Visit: Payer: Self-pay | Admitting: Geriatric Medicine

## 2014-01-18 DIAGNOSIS — I10 Essential (primary) hypertension: Secondary | ICD-10-CM | POA: Diagnosis not present

## 2014-01-18 DIAGNOSIS — N183 Chronic kidney disease, stage 3 unspecified: Secondary | ICD-10-CM | POA: Diagnosis not present

## 2014-01-18 DIAGNOSIS — I129 Hypertensive chronic kidney disease with stage 1 through stage 4 chronic kidney disease, or unspecified chronic kidney disease: Secondary | ICD-10-CM | POA: Diagnosis not present

## 2014-01-18 DIAGNOSIS — Z23 Encounter for immunization: Secondary | ICD-10-CM | POA: Diagnosis not present

## 2014-01-18 DIAGNOSIS — R1084 Generalized abdominal pain: Secondary | ICD-10-CM

## 2014-01-18 DIAGNOSIS — R109 Unspecified abdominal pain: Secondary | ICD-10-CM | POA: Diagnosis not present

## 2014-01-24 ENCOUNTER — Ambulatory Visit
Admission: RE | Admit: 2014-01-24 | Discharge: 2014-01-24 | Disposition: A | Discharge status: Home / Self Care | Payer: Medicare Other | Source: Ambulatory Visit | Attending: Geriatric Medicine | Admitting: Geriatric Medicine

## 2014-01-24 DIAGNOSIS — K5900 Constipation, unspecified: Secondary | ICD-10-CM | POA: Diagnosis not present

## 2014-01-24 DIAGNOSIS — R1084 Generalized abdominal pain: Secondary | ICD-10-CM

## 2014-01-24 MED ORDER — IOHEXOL 300 MG/ML  SOLN
100.0000 mL | Freq: Once | INTRAMUSCULAR | Status: AC | PRN
  Administered 2014-01-24: 100 mL via INTRAVENOUS

## 2014-01-25 ENCOUNTER — Encounter: Payer: Self-pay | Admitting: Cardiology

## 2014-01-27 ENCOUNTER — Encounter: Payer: Self-pay | Admitting: Cardiology

## 2014-01-31 ENCOUNTER — Encounter: Payer: Self-pay | Admitting: Internal Medicine

## 2014-02-03 DIAGNOSIS — N83209 Unspecified ovarian cyst, unspecified side: Secondary | ICD-10-CM | POA: Diagnosis not present

## 2014-02-23 DIAGNOSIS — H905 Unspecified sensorineural hearing loss: Secondary | ICD-10-CM | POA: Diagnosis not present

## 2014-02-23 DIAGNOSIS — H8110 Benign paroxysmal vertigo, unspecified ear: Secondary | ICD-10-CM | POA: Diagnosis not present

## 2014-02-23 DIAGNOSIS — H903 Sensorineural hearing loss, bilateral: Secondary | ICD-10-CM | POA: Diagnosis not present

## 2014-03-14 DIAGNOSIS — N3946 Mixed incontinence: Secondary | ICD-10-CM | POA: Diagnosis not present

## 2014-03-14 DIAGNOSIS — N3942 Incontinence without sensory awareness: Secondary | ICD-10-CM | POA: Diagnosis not present

## 2014-03-14 DIAGNOSIS — N3944 Nocturnal enuresis: Secondary | ICD-10-CM | POA: Diagnosis not present

## 2014-04-02 DIAGNOSIS — Z23 Encounter for immunization: Secondary | ICD-10-CM | POA: Diagnosis not present

## 2014-04-04 ENCOUNTER — Telehealth: Payer: Self-pay | Admitting: Cardiology

## 2014-04-04 ENCOUNTER — Ambulatory Visit (INDEPENDENT_AMBULATORY_CARE_PROVIDER_SITE_OTHER): Payer: Medicare Other | Admitting: *Deleted

## 2014-04-04 ENCOUNTER — Encounter: Payer: Self-pay | Admitting: Internal Medicine

## 2014-04-04 DIAGNOSIS — I442 Atrioventricular block, complete: Secondary | ICD-10-CM | POA: Diagnosis not present

## 2014-04-04 LAB — MDC_IDC_ENUM_SESS_TYPE_REMOTE
Battery Remaining Longevity: 70 mo
Brady Statistic AP VS Percent: 1 %
Brady Statistic AS VP Percent: 99 %
Brady Statistic AS VS Percent: 1 %
Brady Statistic RA Percent Paced: 1 %
Lead Channel Impedance Value: 550 Ohm
Lead Channel Pacing Threshold Amplitude: 0.75 V
Lead Channel Pacing Threshold Amplitude: 1.25 V
Lead Channel Sensing Intrinsic Amplitude: 11.1 mV
Lead Channel Setting Pacing Amplitude: 1 V
Lead Channel Setting Pacing Amplitude: 2.5 V
Lead Channel Setting Pacing Pulse Width: 0.6 ms
Lead Channel Setting Sensing Sensitivity: 2 mV
MDC IDC MSMT BATTERY REMAINING PERCENTAGE: 59 %
MDC IDC MSMT BATTERY VOLTAGE: 2.92 V
MDC IDC MSMT LEADCHNL RA IMPEDANCE VALUE: 310 Ohm
MDC IDC MSMT LEADCHNL RA PACING THRESHOLD PULSEWIDTH: 0.8 ms
MDC IDC MSMT LEADCHNL RA SENSING INTR AMPL: 1.5 mV
MDC IDC MSMT LEADCHNL RV PACING THRESHOLD PULSEWIDTH: 0.6 ms
MDC IDC PG SERIAL: 2270306
MDC IDC SESS DTM: 20151102234916
MDC IDC STAT BRADY AP VP PERCENT: 1 %
MDC IDC STAT BRADY RV PERCENT PACED: 99 %

## 2014-04-04 NOTE — Telephone Encounter (Signed)
LMOVM reminding pt to send remote transmission.   

## 2014-04-05 DIAGNOSIS — L821 Other seborrheic keratosis: Secondary | ICD-10-CM | POA: Diagnosis not present

## 2014-04-05 DIAGNOSIS — L570 Actinic keratosis: Secondary | ICD-10-CM | POA: Diagnosis not present

## 2014-04-06 NOTE — Progress Notes (Signed)
Remote pacemaker transmission.   

## 2014-04-12 ENCOUNTER — Encounter: Payer: Self-pay | Admitting: Internal Medicine

## 2014-04-21 ENCOUNTER — Encounter: Payer: Self-pay | Admitting: Cardiology

## 2014-05-06 ENCOUNTER — Encounter: Payer: Self-pay | Admitting: Cardiology

## 2014-07-06 ENCOUNTER — Ambulatory Visit (INDEPENDENT_AMBULATORY_CARE_PROVIDER_SITE_OTHER): Payer: Medicare Other | Admitting: *Deleted

## 2014-07-06 ENCOUNTER — Telehealth: Payer: Self-pay | Admitting: Cardiology

## 2014-07-06 DIAGNOSIS — I442 Atrioventricular block, complete: Secondary | ICD-10-CM | POA: Diagnosis not present

## 2014-07-06 LAB — MDC_IDC_ENUM_SESS_TYPE_REMOTE
Battery Voltage: 2.92 V
Brady Statistic AP VP Percent: 1 %
Brady Statistic AP VS Percent: 1 %
Brady Statistic AS VS Percent: 1 %
Brady Statistic RA Percent Paced: 1 %
Brady Statistic RV Percent Paced: 99 %
Implantable Pulse Generator Model: 2110
Implantable Pulse Generator Serial Number: 2270306
Lead Channel Impedance Value: 540 Ohm
Lead Channel Pacing Threshold Amplitude: 0.75 V
Lead Channel Pacing Threshold Amplitude: 1.25 V
Lead Channel Pacing Threshold Pulse Width: 0.6 ms
Lead Channel Sensing Intrinsic Amplitude: 1.4 mV
Lead Channel Sensing Intrinsic Amplitude: 11.1 mV
Lead Channel Setting Pacing Amplitude: 1 V
Lead Channel Setting Sensing Sensitivity: 2 mV
MDC IDC MSMT BATTERY REMAINING LONGEVITY: 70 mo
MDC IDC MSMT BATTERY REMAINING PERCENTAGE: 59 %
MDC IDC MSMT LEADCHNL RA IMPEDANCE VALUE: 330 Ohm
MDC IDC MSMT LEADCHNL RA PACING THRESHOLD PULSEWIDTH: 0.8 ms
MDC IDC SESS DTM: 20160203234928
MDC IDC SET LEADCHNL RA PACING AMPLITUDE: 2.5 V
MDC IDC SET LEADCHNL RV PACING PULSEWIDTH: 0.6 ms
MDC IDC STAT BRADY AS VP PERCENT: 99 %

## 2014-07-06 NOTE — Telephone Encounter (Signed)
LMOVM reminding pt to send remote transmission.   

## 2014-07-07 NOTE — Progress Notes (Signed)
Remote pacemaker transmission.   

## 2014-07-14 ENCOUNTER — Other Ambulatory Visit (HOSPITAL_COMMUNITY): Payer: Self-pay | Admitting: Cardiology

## 2014-07-14 DIAGNOSIS — I6523 Occlusion and stenosis of bilateral carotid arteries: Secondary | ICD-10-CM

## 2014-07-15 ENCOUNTER — Encounter (HOSPITAL_COMMUNITY): Payer: Medicare Other

## 2014-07-15 ENCOUNTER — Ambulatory Visit (HOSPITAL_COMMUNITY): Payer: Medicare Other | Attending: Cardiovascular Disease | Admitting: Cardiology

## 2014-07-15 DIAGNOSIS — I6523 Occlusion and stenosis of bilateral carotid arteries: Secondary | ICD-10-CM | POA: Diagnosis not present

## 2014-07-15 NOTE — Progress Notes (Signed)
Carotid duplex performed 

## 2014-07-19 DIAGNOSIS — K5900 Constipation, unspecified: Secondary | ICD-10-CM | POA: Diagnosis not present

## 2014-07-19 DIAGNOSIS — F325 Major depressive disorder, single episode, in full remission: Secondary | ICD-10-CM | POA: Diagnosis not present

## 2014-07-19 DIAGNOSIS — D249 Benign neoplasm of unspecified breast: Secondary | ICD-10-CM | POA: Diagnosis not present

## 2014-07-19 DIAGNOSIS — N183 Chronic kidney disease, stage 3 (moderate): Secondary | ICD-10-CM | POA: Diagnosis not present

## 2014-07-19 DIAGNOSIS — K224 Dyskinesia of esophagus: Secondary | ICD-10-CM | POA: Diagnosis not present

## 2014-07-19 DIAGNOSIS — R32 Unspecified urinary incontinence: Secondary | ICD-10-CM | POA: Diagnosis not present

## 2014-07-19 DIAGNOSIS — E780 Pure hypercholesterolemia: Secondary | ICD-10-CM | POA: Diagnosis not present

## 2014-07-19 DIAGNOSIS — Z0000 Encounter for general adult medical examination without abnormal findings: Secondary | ICD-10-CM | POA: Diagnosis not present

## 2014-07-19 DIAGNOSIS — I25810 Atherosclerosis of coronary artery bypass graft(s) without angina pectoris: Secondary | ICD-10-CM | POA: Diagnosis not present

## 2014-07-19 DIAGNOSIS — Z79899 Other long term (current) drug therapy: Secondary | ICD-10-CM | POA: Diagnosis not present

## 2014-07-21 ENCOUNTER — Encounter: Payer: Self-pay | Admitting: Internal Medicine

## 2014-08-01 ENCOUNTER — Encounter: Payer: Self-pay | Admitting: Cardiology

## 2014-08-05 ENCOUNTER — Encounter: Payer: Self-pay | Admitting: Internal Medicine

## 2014-09-21 DIAGNOSIS — H11442 Conjunctival cysts, left eye: Secondary | ICD-10-CM | POA: Diagnosis not present

## 2014-10-16 ENCOUNTER — Other Ambulatory Visit: Payer: Self-pay | Admitting: Internal Medicine

## 2014-10-21 ENCOUNTER — Encounter: Payer: Medicare Other | Admitting: Internal Medicine

## 2014-10-26 DIAGNOSIS — H524 Presbyopia: Secondary | ICD-10-CM | POA: Diagnosis not present

## 2014-10-26 DIAGNOSIS — H26493 Other secondary cataract, bilateral: Secondary | ICD-10-CM | POA: Diagnosis not present

## 2014-11-08 ENCOUNTER — Encounter: Payer: Self-pay | Admitting: Internal Medicine

## 2014-11-08 ENCOUNTER — Ambulatory Visit (INDEPENDENT_AMBULATORY_CARE_PROVIDER_SITE_OTHER): Payer: Medicare Other | Admitting: Internal Medicine

## 2014-11-08 VITALS — BP 140/60 | HR 68 | Ht 62.0 in | Wt 131.2 lb

## 2014-11-08 DIAGNOSIS — I442 Atrioventricular block, complete: Secondary | ICD-10-CM | POA: Diagnosis not present

## 2014-11-08 DIAGNOSIS — Z950 Presence of cardiac pacemaker: Secondary | ICD-10-CM

## 2014-11-08 DIAGNOSIS — I6523 Occlusion and stenosis of bilateral carotid arteries: Secondary | ICD-10-CM

## 2014-11-08 DIAGNOSIS — Z45018 Encounter for adjustment and management of other part of cardiac pacemaker: Secondary | ICD-10-CM | POA: Diagnosis not present

## 2014-11-08 LAB — CUP PACEART INCLINIC DEVICE CHECK
Battery Remaining Longevity: 102 mo
Battery Voltage: 2.92 V
Brady Statistic RV Percent Paced: 99.79 %
Date Time Interrogation Session: 20160607140355
Lead Channel Impedance Value: 600 Ohm
Lead Channel Pacing Threshold Amplitude: 0.75 V
Lead Channel Pacing Threshold Amplitude: 1.25 V
Lead Channel Pacing Threshold Pulse Width: 0.6 ms
Lead Channel Pacing Threshold Pulse Width: 0.8 ms
Lead Channel Sensing Intrinsic Amplitude: 11.2 mV
Lead Channel Sensing Intrinsic Amplitude: 3.4 mV
Lead Channel Setting Pacing Amplitude: 2.5 V
Lead Channel Setting Pacing Pulse Width: 0.6 ms
MDC IDC MSMT LEADCHNL RA IMPEDANCE VALUE: 325 Ohm
MDC IDC MSMT LEADCHNL RA PACING THRESHOLD AMPLITUDE: 1.25 V
MDC IDC MSMT LEADCHNL RA PACING THRESHOLD PULSEWIDTH: 0.8 ms
MDC IDC SET LEADCHNL RV PACING AMPLITUDE: 1 V
MDC IDC SET LEADCHNL RV SENSING SENSITIVITY: 2 mV
MDC IDC STAT BRADY RA PERCENT PACED: 1 %
Pulse Gen Model: 2110
Pulse Gen Serial Number: 2270306

## 2014-11-08 NOTE — Patient Instructions (Signed)
Medication Instructions:  Your physician recommends that you continue on your current medications as directed. Please refer to the Current Medication list given to you today.  Labwork: None ordered  Testing/Procedures: None ordered  Follow-Up: Remote monitoring is used to monitor your Pacemaker of ICD from home. This monitoring reduces the number of office visits required to check your device to one time per year. It allows [place] to keep an eye on the functioning of your device to ensure it is working properly. You are scheduled for a device check from home on 02/07/15. You may send your transmission at any time that day. If you have a wireless device, the transmission will be sent automatically. After your physician reviews your transmission, you will receive a postcard with your next transmission date.  Your physician wants you to follow-up in: 1 year with Dr. Caryl Comes.  You will receive a reminder letter in the mail two months in advance. If you don't receive a letter, please call our office to schedule the follow-up appointment.  Thank you for choosing [facility]!!

## 2014-11-08 NOTE — Progress Notes (Signed)
Electrophysiology Office Note   Date:  11/15/2014   ID:  [name] [name], DOB 09-22-1920, MRN [id number]  PCP:  Mathews Argyle, MD  Cardiologist * Primary Electrophysiologist:    Virl Axe, MD    Chief Complaint  Patient presents with  . Annual Exam     History of Present Illness: [name] [name] is a 81 y.o. female   seen in followup for ischemic heart disease with a modest depression of LV function. She is status post bypass surgery in 2002 and catheterization in 2007 demonstrating three out of four grafts patent; ejection fraction at that time was normal. Echocardiogram 2012 demonstrated normal left ventricular function with very mild aortic stenosis    She has a history of complete heart block and is status post pacemaker implantation . She also has a history of atrial tachycardia.   The patient denies chest pain, shortness of breath, nocturnal dyspnea, orthopnea or peripheral edema. There have been no palpitations, lightheadedness or syncope.   her husband has recently been moved to assisted living. She sees him every day at 2:00        Past Medical History  Diagnosis Date  . CAD (coronary artery disease)   . LV dysfunction     intercurrent normalization of lv function- cath 2007  . Mitral regurgitation     moderate  . Syncope   . Complete heart block   . Pacemaker -[place]     Date implanted 1996; generator replacement 2010  . Hyperlipidemia   . Osteoarthritis   . Decreased hearing     left ear  . Hyperkalemia   . Macular degeneration     impaired vision  . Anxiety      on med bid since 2007  . Carotid arterial disease     sp cae    Past Surgical History  Procedure Laterality Date  . Coronary angioplasty  2007  . Abdominal hysterectomy    . Pacemaker placement  1995 and replaced April 2010  . Coronary artery bypass graft  1995    x4  . Carotid endarterectomy  1995  . Rt knee surgery  05/2009    [facility]     Current Outpatient  Prescriptions  Medication Sig Dispense Refill  . acetaminophen (TYLENOL) 325 MG tablet Take 650 mg by mouth every 6 (six) hours as needed.      Marland Kitchen aspirin 81 MG chewable tablet Chew 81 mg by mouth daily.      . carvedilol (COREG) 25 MG tablet Take 25 mg by mouth 2 (two) times daily with a meal.  6  . carvedilol (COREG) 25 MG tablet Take 25 mg by mouth 2 (two) times daily with a meal.    . Cholecalciferol (VITAMIN D3) 5000 UNITS CAPS Take 1 capsule by mouth daily.    . furosemide (LASIX) 20 MG tablet TAKE 1 TABLET BY MOUTH ONCE DAILY 30 tablet 4  . isosorbide mononitrate (IMDUR) 30 MG 24 hr tablet Take 30 mg by mouth daily.    . Melatonin 5 MG TABS Take 1 tablet by mouth daily.     . Multiple Vitamins-Minerals (CENTRUM SILVER PO) Take 1 tablet by mouth daily.     . Multiple Vitamins-Minerals (ICAPS PO) Take 1 tablet by mouth daily.     Marland Kitchen omeprazole (PRILOSEC) 20 MG capsule Take 1 capsule (20 mg total) by mouth daily. 30 capsule 0  . oxybutynin (DITROPAN) 5 MG tablet Take 1 tablet by mouth 2 (two) times daily.    Marland Kitchen  sertraline (ZOLOFT) 50 MG tablet Take 50 mg by mouth daily.    . traMADol (ULTRAM) 50 MG tablet Take 50 mg by mouth as needed for pain.     No current facility-administered medications for this visit.    Allergies:   Hydrocodone and Ibuprofen   Social History:  The patient  reports that she has quit smoking. She does not have any smokeless tobacco history on file. She reports that she drinks alcohol.   Family History:  The patient's    family history includes Melanoma in her daughter; Other in her father and mother.    ROS:  Please see the history of present illness and past medical history   PHYSICAL EXAM: VS:  BP 140/60 mmHg  Pulse 68  Ht 5\' 2"  (1.575 m)  Wt 131 lb 3.2 oz (59.512 kg)  BMI 23.99 kg/m2 , BMI Body mass index is 23.99 kg/(m^2). GEN: Well nourished, well developed, in no acute distress HEENT: normal Neck:  JVD flat, carotid bruits, or masses Cardiac: REGULAR  RATE and RHYTHM ; *  Back without  kyphosis; No CVAT Respiratory:  clear to auscultation bilaterally, normal work of breathing GI: soft, nontender, nondistended, + BS MS: no deformity or atrophy Extremities no clubbing cyanosis +  edema Skin: warm and dry,  device pocket is well healed without teathering Neuro:  Strength and sensation are intact Psych: euthymic mood, full affect  EKG:   *  Device interrogation is reviewed today in detail.  See PaceArt for details.   Recent Labs: No results found for requested labs within last 365 days.    Lipid Panel     Component Value Date/Time   CHOL 134 12/04/2011 1142   TRIG 141.0 12/04/2011 1142   HDL 48.90 12/04/2011 1142   CHOLHDL 3 12/04/2011 1142   VLDL 28.2 12/04/2011 1142   LDLCALC 57 12/04/2011 1142   LDLDIRECT 64.2 12/04/2011 1142     Wt Readings from Last 3 Encounters:  11/08/14 131 lb 3.2 oz (59.512 kg)  09/28/13 138 lb (62.596 kg)  09/17/12 141 lb 6.4 oz (64.139 kg)      Other studies Reviewed: Additional studies/ records that were reviewed today include: none     ASSESSMENT AND PLAN:   Complete heart block stable post pacing  Pacemaker-St. Jude The patient's device was interrogated. The information was reviewed. No changes were made in the programming.   Aortic stenosis  Hypertension  BP stable     Virl Axe, MD  11/15/2014 2:15 PM     [address] [place] [address] [phone] (office) [phone] (fax)

## 2014-11-25 DIAGNOSIS — Z79899 Other long term (current) drug therapy: Secondary | ICD-10-CM | POA: Diagnosis not present

## 2014-11-25 DIAGNOSIS — N183 Chronic kidney disease, stage 3 (moderate): Secondary | ICD-10-CM | POA: Diagnosis not present

## 2014-11-25 DIAGNOSIS — R413 Other amnesia: Secondary | ICD-10-CM | POA: Diagnosis not present

## 2014-11-25 DIAGNOSIS — I340 Nonrheumatic mitral (valve) insufficiency: Secondary | ICD-10-CM | POA: Diagnosis not present

## 2014-11-25 DIAGNOSIS — I25810 Atherosclerosis of coronary artery bypass graft(s) without angina pectoris: Secondary | ICD-10-CM | POA: Diagnosis not present

## 2014-11-25 DIAGNOSIS — R5383 Other fatigue: Secondary | ICD-10-CM | POA: Diagnosis not present

## 2014-11-29 ENCOUNTER — Other Ambulatory Visit: Payer: Self-pay

## 2014-11-29 ENCOUNTER — Other Ambulatory Visit: Payer: Self-pay | Admitting: Geriatric Medicine

## 2014-11-29 ENCOUNTER — Ambulatory Visit (HOSPITAL_COMMUNITY): Payer: Medicare Other | Attending: Geriatric Medicine

## 2014-11-29 DIAGNOSIS — I340 Nonrheumatic mitral (valve) insufficiency: Secondary | ICD-10-CM | POA: Insufficient documentation

## 2014-11-29 DIAGNOSIS — I350 Nonrheumatic aortic (valve) stenosis: Secondary | ICD-10-CM | POA: Diagnosis not present

## 2014-11-29 DIAGNOSIS — I341 Nonrheumatic mitral (valve) prolapse: Secondary | ICD-10-CM | POA: Diagnosis not present

## 2014-12-26 ENCOUNTER — Other Ambulatory Visit: Payer: Self-pay | Admitting: Geriatric Medicine

## 2014-12-26 DIAGNOSIS — R911 Solitary pulmonary nodule: Secondary | ICD-10-CM | POA: Diagnosis not present

## 2014-12-26 DIAGNOSIS — R32 Unspecified urinary incontinence: Secondary | ICD-10-CM | POA: Diagnosis not present

## 2014-12-26 DIAGNOSIS — G3184 Mild cognitive impairment, so stated: Secondary | ICD-10-CM | POA: Diagnosis not present

## 2015-01-12 ENCOUNTER — Ambulatory Visit
Admission: RE | Admit: 2015-01-12 | Discharge: 2015-01-12 | Disposition: A | Discharge status: Home / Self Care | Payer: Medicare Other | Source: Ambulatory Visit | Attending: Geriatric Medicine | Admitting: Geriatric Medicine

## 2015-01-12 ENCOUNTER — Other Ambulatory Visit: Payer: Medicare Other

## 2015-01-12 DIAGNOSIS — R911 Solitary pulmonary nodule: Secondary | ICD-10-CM

## 2015-01-12 DIAGNOSIS — Z951 Presence of aortocoronary bypass graft: Secondary | ICD-10-CM | POA: Diagnosis not present

## 2015-02-07 ENCOUNTER — Ambulatory Visit (INDEPENDENT_AMBULATORY_CARE_PROVIDER_SITE_OTHER): Payer: Medicare Other | Admitting: *Deleted

## 2015-02-07 DIAGNOSIS — I442 Atrioventricular block, complete: Secondary | ICD-10-CM | POA: Diagnosis not present

## 2015-02-09 NOTE — Progress Notes (Signed)
Remote pacemaker transmission.   

## 2015-02-13 LAB — CUP PACEART REMOTE DEVICE CHECK
Battery Remaining Percentage: 73 %
Battery Voltage: 2.92 V
Brady Statistic AP VP Percent: 3.2 %
Brady Statistic AP VS Percent: 1 %
Brady Statistic AS VP Percent: 96 %
Brady Statistic AS VS Percent: 1 %
Brady Statistic RV Percent Paced: 99 %
Date Time Interrogation Session: 20160904230055
Lead Channel Impedance Value: 590 Ohm
Lead Channel Pacing Threshold Amplitude: 0.75 V
Lead Channel Pacing Threshold Amplitude: 1.25 V
Lead Channel Pacing Threshold Pulse Width: 0.6 ms
Lead Channel Sensing Intrinsic Amplitude: 1.3 mV
Lead Channel Setting Pacing Amplitude: 1 V
Lead Channel Setting Pacing Amplitude: 2.5 V
Lead Channel Setting Pacing Pulse Width: 0.6 ms
Lead Channel Setting Sensing Sensitivity: 2 mV
MDC IDC MSMT BATTERY REMAINING LONGEVITY: 88 mo
MDC IDC MSMT LEADCHNL RA IMPEDANCE VALUE: 340 Ohm
MDC IDC MSMT LEADCHNL RA PACING THRESHOLD PULSEWIDTH: 0.8 ms
MDC IDC MSMT LEADCHNL RV SENSING INTR AMPL: 12 mV
MDC IDC STAT BRADY RA PERCENT PACED: 2.8 %
Pulse Gen Model: 2110
Pulse Gen Serial Number: 2270306

## 2015-02-16 DIAGNOSIS — H8111 Benign paroxysmal vertigo, right ear: Secondary | ICD-10-CM | POA: Diagnosis not present

## 2015-02-16 DIAGNOSIS — H903 Sensorineural hearing loss, bilateral: Secondary | ICD-10-CM | POA: Diagnosis not present

## 2015-02-21 DIAGNOSIS — Z23 Encounter for immunization: Secondary | ICD-10-CM | POA: Diagnosis not present

## 2015-03-02 ENCOUNTER — Other Ambulatory Visit: Payer: Self-pay | Admitting: Internal Medicine

## 2015-03-08 ENCOUNTER — Encounter: Payer: Self-pay | Admitting: Cardiology

## 2015-03-14 ENCOUNTER — Encounter: Payer: Self-pay | Admitting: Internal Medicine

## 2015-05-10 ENCOUNTER — Ambulatory Visit (INDEPENDENT_AMBULATORY_CARE_PROVIDER_SITE_OTHER): Payer: Medicare Other | Admitting: *Deleted

## 2015-05-10 DIAGNOSIS — I442 Atrioventricular block, complete: Secondary | ICD-10-CM

## 2015-05-10 NOTE — Progress Notes (Signed)
Remote pacemaker transmission.   

## 2015-05-18 LAB — CUP PACEART REMOTE DEVICE CHECK
Battery Remaining Longevity: 88 mo
Battery Remaining Percentage: 73 %
Battery Voltage: 2.92 V
Brady Statistic AP VP Percent: 3.6 %
Brady Statistic AS VP Percent: 96 %
Brady Statistic AS VS Percent: 1 %
Brady Statistic RA Percent Paced: 3.2 %
Brady Statistic RV Percent Paced: 99 %
Date Time Interrogation Session: 20161206233605
Implantable Lead Location: 753859
Implantable Lead Location: 753860
Lead Channel Impedance Value: 580 Ohm
Lead Channel Pacing Threshold Amplitude: 1.25 V
Lead Channel Pacing Threshold Pulse Width: 0.6 ms
Lead Channel Pacing Threshold Pulse Width: 0.8 ms
Lead Channel Sensing Intrinsic Amplitude: 12 mV
Lead Channel Setting Pacing Amplitude: 1 V
Lead Channel Setting Pacing Amplitude: 2.5 V
Lead Channel Setting Pacing Pulse Width: 0.6 ms
MDC IDC LEAD IMPLANT DT: 19961115
MDC IDC LEAD IMPLANT DT: 19961115
MDC IDC MSMT LEADCHNL RA IMPEDANCE VALUE: 340 Ohm
MDC IDC MSMT LEADCHNL RA SENSING INTR AMPL: 1.1 mV
MDC IDC MSMT LEADCHNL RV PACING THRESHOLD AMPLITUDE: 0.75 V
MDC IDC PG SERIAL: 2270306
MDC IDC SET LEADCHNL RV SENSING SENSITIVITY: 2 mV
MDC IDC STAT BRADY AP VS PERCENT: 1 %
Pulse Gen Model: 2110

## 2015-05-24 ENCOUNTER — Encounter: Payer: Self-pay | Admitting: Cardiology

## 2015-06-08 ENCOUNTER — Encounter: Payer: Self-pay | Admitting: Cardiology

## 2015-06-26 ENCOUNTER — Ambulatory Visit: Payer: Medicare Other | Admitting: Podiatry

## 2015-07-20 ENCOUNTER — Other Ambulatory Visit: Payer: Self-pay | Admitting: Geriatric Medicine

## 2015-07-20 DIAGNOSIS — R911 Solitary pulmonary nodule: Secondary | ICD-10-CM

## 2015-07-26 ENCOUNTER — Ambulatory Visit
Admission: RE | Admit: 2015-07-26 | Discharge: 2015-07-26 | Disposition: A | Discharge status: Home / Self Care | Payer: Medicare Other | Source: Ambulatory Visit | Attending: Geriatric Medicine | Admitting: Geriatric Medicine

## 2015-07-26 DIAGNOSIS — R911 Solitary pulmonary nodule: Secondary | ICD-10-CM | POA: Diagnosis not present

## 2015-08-02 IMAGING — CT CT ABD-PELV W/ CM
3 of 5 series · 11 of 36 positions shown, 17 images · IV contrast (omnipaque)
Comparison: None.

CLINICAL DATA: Right lower quadrant pain and constipation.

EXAM:
CT ABDOMEN AND PELVIS WITH CONTRAST
TECHNIQUE: Multidetector CT imaging of the abdomen and pelvis was performed
using the standard protocol following bolus administration of
intravenous contrast.
CONTRAST:  100mL OMNIPAQUE IOHEXOL 300 MG/ML  SOLN

[Series 3: abd/pelvis with · axial · 0.75mm/px · z∈[-374,-80]mm · 7 of 79 slices shown, 12 images]
[im 10/79  soft-tissue]
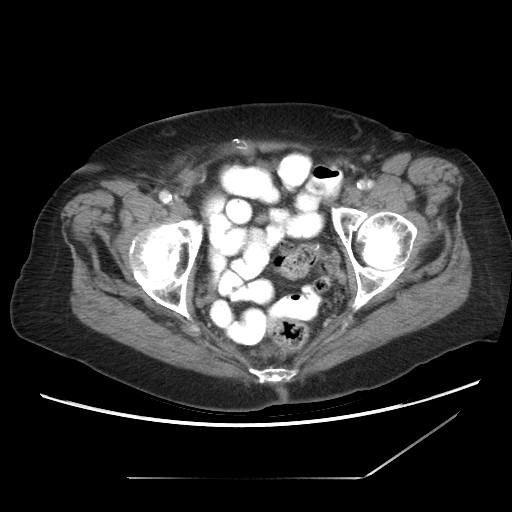
[im 10/79  bone]
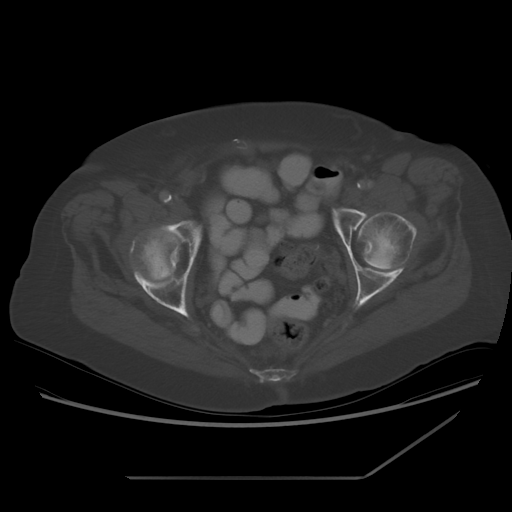
[im 20/79  soft-tissue]
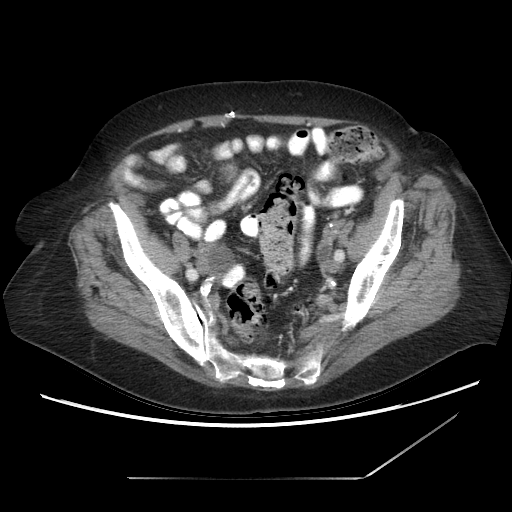
[im 30/79  soft-tissue]
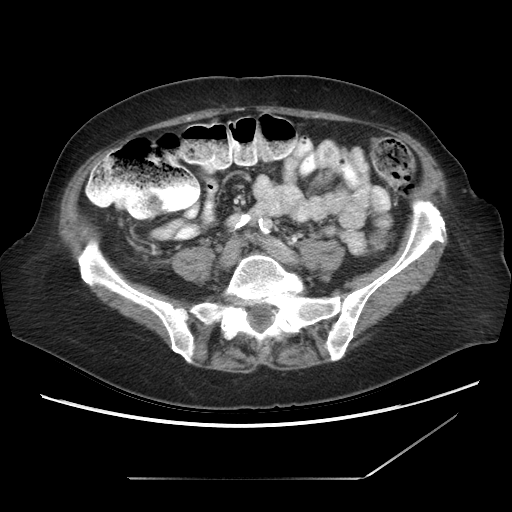
[im 40/79  soft-tissue]
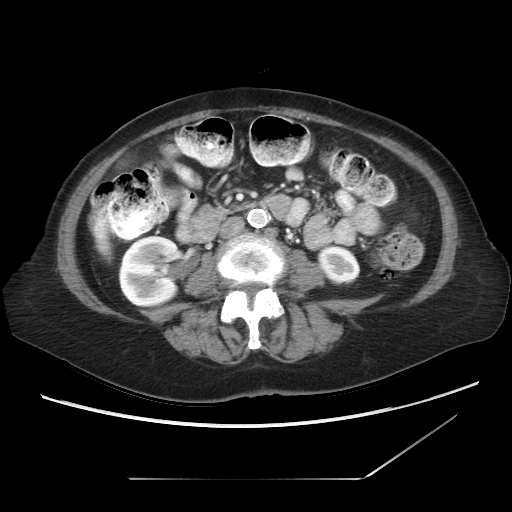
[im 40/79  lung]
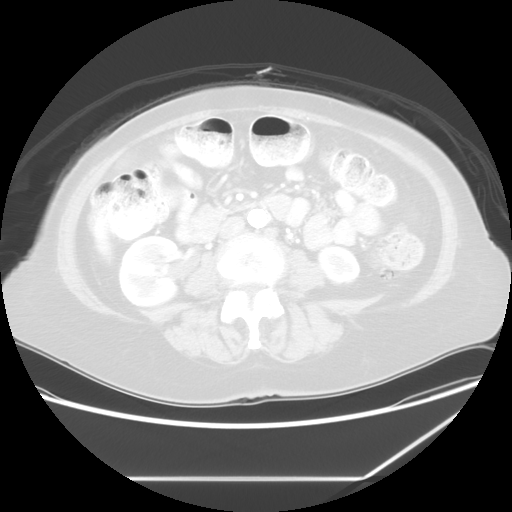
[im 49/79  soft-tissue]
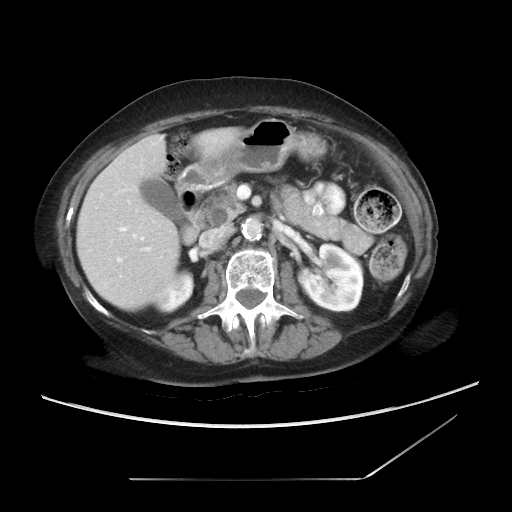
[im 49/79  lung]
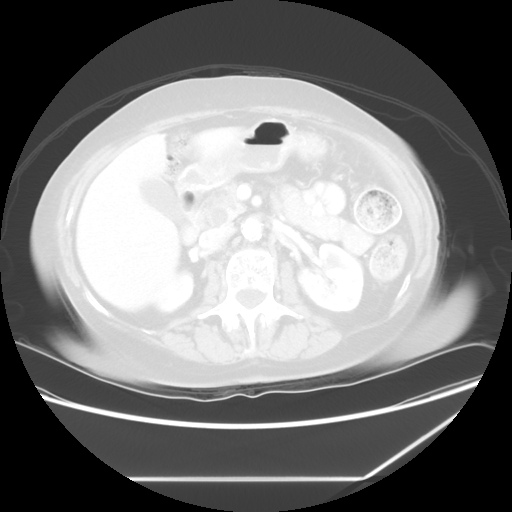
[im 59/79  soft-tissue]
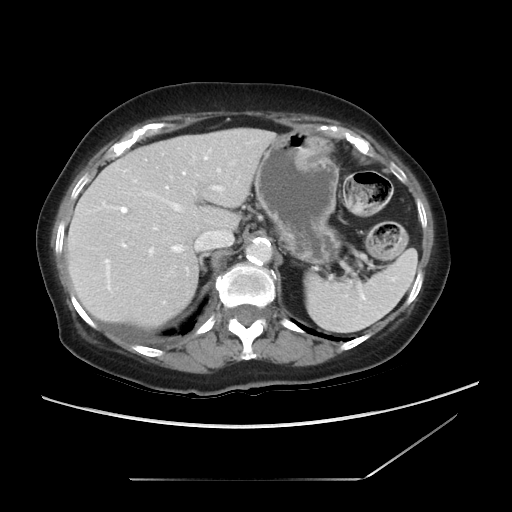
[im 59/79  lung]
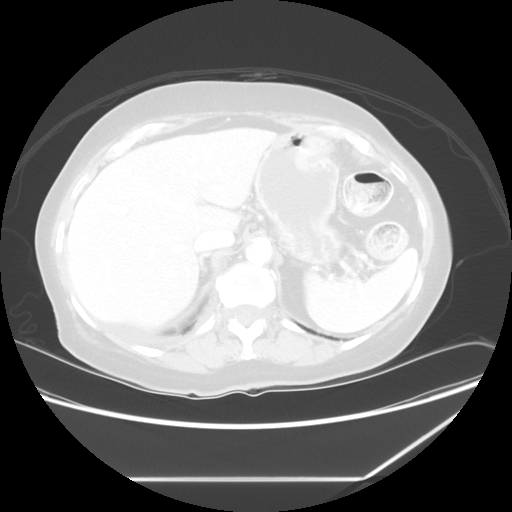
[im 69/79  soft-tissue]
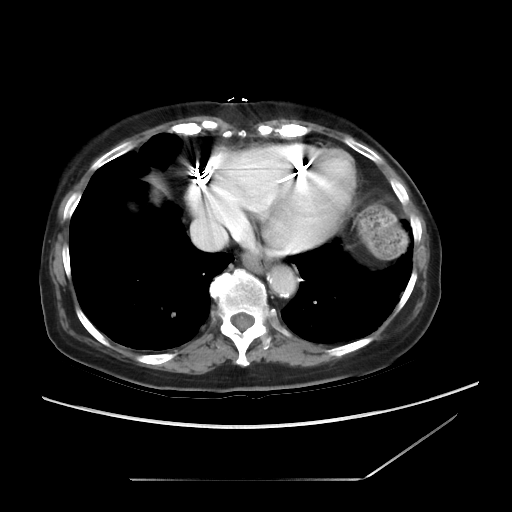
[im 69/79  lung]
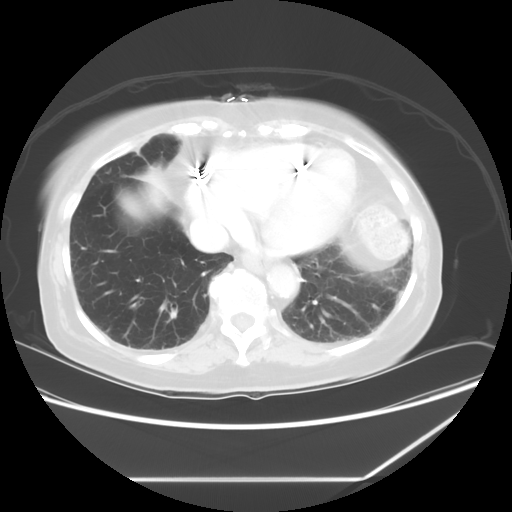

[Series 601: coronal body · coronal · 0.82mm/px · 1 of 106 slices shown, 2 images]
[im 36/106  soft-tissue]
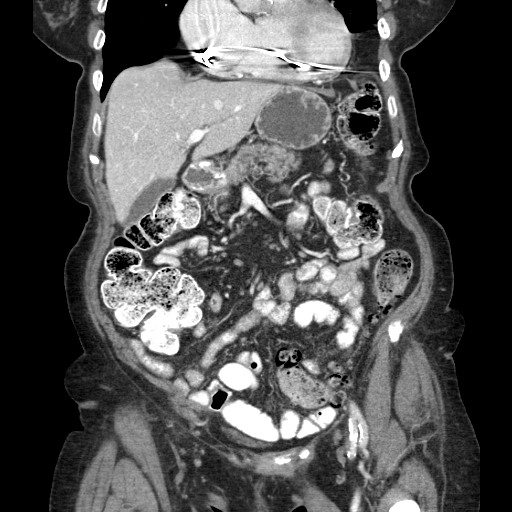
[im 36/106  bone]
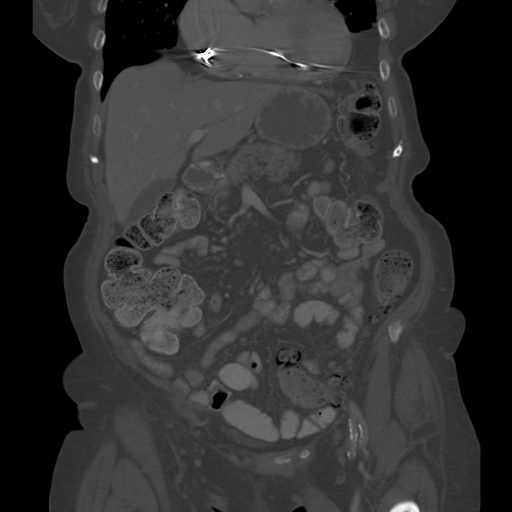

[Series 602: sagittal body · sagittal · 0.82mm/px · 3 of 155 slices shown]
[im 10/155  soft-tissue]
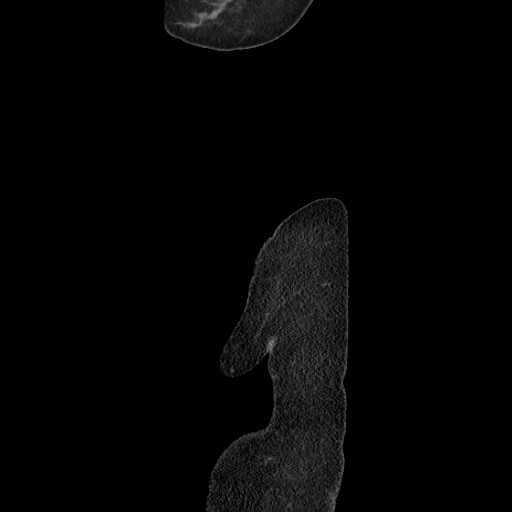
[im 29/155  soft-tissue]
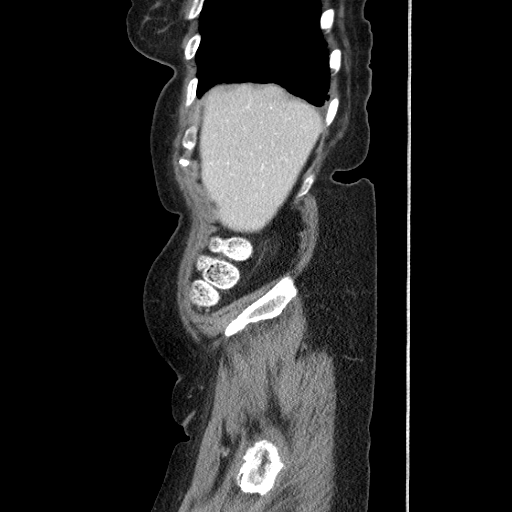
[im 49/155  soft-tissue]
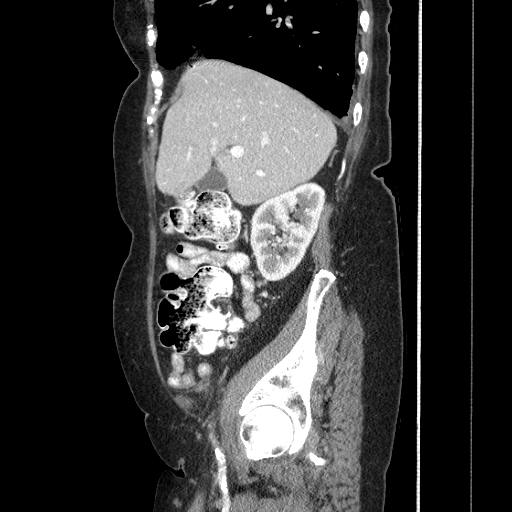

[11 of 36 positions shown; findings below may reference images not displayed]

FINDINGS: Lung bases show a 6 mm (5 x 7 mm) irregular nodule in the right
lower lobe (series 4, image 3). Probable scattered pulmonary
parenchymal scarring. Heart is at the upper limits of normal in size
to mildly enlarged. No pericardial effusion. Trace right pleural
fluid. Small hiatal hernia. Thickening of the distal esophageal wall
can be seen in the setting of gastroesophageal reflux disease.

Mild intrahepatic and extrahepatic biliary duct dilatation with the
extrahepatic bile duct measuring up to 10 mm, possibly within normal
limits for age. Liver, gallbladder and adrenal glands are otherwise
unremarkable. 8 mm low-attenuation lesion in the upper pole right
kidney is likely a cyst. Kidneys, spleen, pancreas and stomach are
otherwise unremarkable. Duodenal diverticulum is incidentally noted.
Small bowel is otherwise unremarkable. Although the appendix is not
readily visualized, there are no inflammatory changes in the right
lower quadrant to indicate appendicitis. A fair amount of stool is
seen in the colon.

Mild inflammatory stranding or fluid is seen in the anterior right
anatomic pelvis (series 3, image 68). On coronal image 32, there is
a defect in the adjacent pelvic wall.

Atherosclerotic calcification of the arterial vasculature without
abdominal aortic aneurysm. Hysterectomy. Ovaries are visualized. A
2.4 cm heterogeneous low-attenuation lesion is seen in the right
ovary (series 3, image 61). There are scattered subcentimeter short
axis lymph nodes in the abdomen and pelvis. No pathologically
enlarged lymph nodes. No free fluid. Postoperative changes in the
ventral abdominal and pelvic wall.

Probable Tarlov cyst in a left sacrum. Degenerative changes are seen
in the spine. Minimal grade 1 anterolisthesis of L4 on L5, likely
degenerative in etiology. No worrisome lytic or sclerotic lesions.
IMPRESSION: 1. Mild inflammatory stranding or fluid in the anterior right
anatomic pelvis, with evidence of an associated hernia, especially
on the coronal images.
2. Intrahepatic and extrahepatic biliary duct dilatation may be
within normal limits for age.
3. Homogeneous low-attenuation lesion in the right ovary. Current
guidelines recommend baseline sonographic evaluation but clinical
correlation is recommended, given patient's age. This recommendation
follows ACR consensus guidelines: White Paper of the ACR Incidental
Findings Committee II on Adnexal Findings. [HOSPITAL]
[DATE].
4. Irregular right lower lobe nodule. If the patient is at high risk
for bronchogenic carcinoma, follow-up chest CT at 6-12 months is
recommended. If the patient is at low risk for bronchogenic
carcinoma, follow-up chest CT at 12 months is recommended. This
recommendation follows the consensus statement: Guidelines for
Management of Small Pulmonary Nodules Detected on CT Scans: A
Statement from the [HOSPITAL] as published in Radiology
6550;[DATE].
5. Trace right pleural fluid.
6. Fair amount of stool in the colon is indicative of constipation.

## 2015-08-09 ENCOUNTER — Ambulatory Visit (INDEPENDENT_AMBULATORY_CARE_PROVIDER_SITE_OTHER): Payer: Medicare Other | Admitting: *Deleted

## 2015-08-09 DIAGNOSIS — I442 Atrioventricular block, complete: Secondary | ICD-10-CM | POA: Diagnosis not present

## 2015-08-10 LAB — CUP PACEART REMOTE DEVICE CHECK
Battery Voltage: 2.89 V
Brady Statistic AP VP Percent: 3.6 %
Brady Statistic AS VS Percent: 1 %
Date Time Interrogation Session: 20170309005423
Implantable Lead Implant Date: 19961115
Implantable Lead Location: 753860
Lead Channel Impedance Value: 360 Ohm
Lead Channel Impedance Value: 600 Ohm
Lead Channel Pacing Threshold Amplitude: 0.75 V
Lead Channel Pacing Threshold Pulse Width: 0.8 ms
Lead Channel Sensing Intrinsic Amplitude: 12 mV
Lead Channel Setting Pacing Amplitude: 2.5 V
Lead Channel Setting Pacing Pulse Width: 0.6 ms
Lead Channel Setting Sensing Sensitivity: 2 mV
MDC IDC LEAD IMPLANT DT: 19961115
MDC IDC LEAD LOCATION: 753859
MDC IDC MSMT BATTERY REMAINING LONGEVITY: 70 mo
MDC IDC MSMT BATTERY REMAINING PERCENTAGE: 57 %
MDC IDC MSMT LEADCHNL RA PACING THRESHOLD AMPLITUDE: 1.25 V
MDC IDC MSMT LEADCHNL RA SENSING INTR AMPL: 3.1 mV
MDC IDC MSMT LEADCHNL RV PACING THRESHOLD PULSEWIDTH: 0.6 ms
MDC IDC PG SERIAL: 2270306
MDC IDC SET LEADCHNL RV PACING AMPLITUDE: 1 V
MDC IDC STAT BRADY AP VS PERCENT: 1 %
MDC IDC STAT BRADY AS VP PERCENT: 96 %
MDC IDC STAT BRADY RA PERCENT PACED: 3.2 %
MDC IDC STAT BRADY RV PERCENT PACED: 99 %
Pulse Gen Model: 2110

## 2015-08-11 NOTE — Progress Notes (Signed)
Remote pacemaker transmission.   

## 2015-08-12 NOTE — Progress Notes (Signed)
Normal remote reviewed.  Next follow up 11/2015 in clinic

## 2015-08-16 ENCOUNTER — Encounter: Payer: Self-pay | Admitting: Cardiology

## 2015-08-16 DIAGNOSIS — F0280 Dementia in other diseases classified elsewhere without behavioral disturbance: Secondary | ICD-10-CM | POA: Diagnosis not present

## 2015-08-16 DIAGNOSIS — Z0000 Encounter for general adult medical examination without abnormal findings: Secondary | ICD-10-CM | POA: Diagnosis not present

## 2015-08-16 DIAGNOSIS — N183 Chronic kidney disease, stage 3 (moderate): Secondary | ICD-10-CM | POA: Diagnosis not present

## 2015-08-16 DIAGNOSIS — E7800 Pure hypercholesterolemia, unspecified: Secondary | ICD-10-CM | POA: Diagnosis not present

## 2015-08-16 DIAGNOSIS — G301 Alzheimer's disease with late onset: Secondary | ICD-10-CM | POA: Diagnosis not present

## 2015-08-16 DIAGNOSIS — Z79899 Other long term (current) drug therapy: Secondary | ICD-10-CM | POA: Diagnosis not present

## 2015-08-16 DIAGNOSIS — F325 Major depressive disorder, single episode, in full remission: Secondary | ICD-10-CM | POA: Diagnosis not present

## 2015-08-16 DIAGNOSIS — R7309 Other abnormal glucose: Secondary | ICD-10-CM | POA: Diagnosis not present

## 2015-08-16 DIAGNOSIS — I700 Atherosclerosis of aorta: Secondary | ICD-10-CM | POA: Diagnosis not present

## 2015-09-19 DIAGNOSIS — I5032 Chronic diastolic (congestive) heart failure: Secondary | ICD-10-CM | POA: Diagnosis not present

## 2015-09-19 DIAGNOSIS — I350 Nonrheumatic aortic (valve) stenosis: Secondary | ICD-10-CM | POA: Diagnosis not present

## 2015-09-19 DIAGNOSIS — F325 Major depressive disorder, single episode, in full remission: Secondary | ICD-10-CM | POA: Diagnosis not present

## 2015-09-19 DIAGNOSIS — N183 Chronic kidney disease, stage 3 (moderate): Secondary | ICD-10-CM | POA: Diagnosis not present

## 2015-09-19 DIAGNOSIS — R413 Other amnesia: Secondary | ICD-10-CM | POA: Diagnosis not present

## 2015-10-10 DIAGNOSIS — L309 Dermatitis, unspecified: Secondary | ICD-10-CM | POA: Diagnosis not present

## 2015-10-17 DIAGNOSIS — L03031 Cellulitis of right toe: Secondary | ICD-10-CM | POA: Diagnosis not present

## 2015-10-17 DIAGNOSIS — L02611 Cutaneous abscess of right foot: Secondary | ICD-10-CM | POA: Diagnosis not present

## 2015-10-17 DIAGNOSIS — L602 Onychogryphosis: Secondary | ICD-10-CM | POA: Diagnosis not present

## 2015-10-17 DIAGNOSIS — M79674 Pain in right toe(s): Secondary | ICD-10-CM | POA: Diagnosis not present

## 2015-11-01 DIAGNOSIS — L03031 Cellulitis of right toe: Secondary | ICD-10-CM | POA: Diagnosis not present

## 2015-11-01 DIAGNOSIS — L602 Onychogryphosis: Secondary | ICD-10-CM | POA: Diagnosis not present

## 2015-11-22 DIAGNOSIS — L03031 Cellulitis of right toe: Secondary | ICD-10-CM | POA: Diagnosis not present

## 2015-11-22 DIAGNOSIS — L03032 Cellulitis of left toe: Secondary | ICD-10-CM | POA: Diagnosis not present

## 2015-12-01 DIAGNOSIS — L723 Sebaceous cyst: Secondary | ICD-10-CM | POA: Diagnosis not present

## 2015-12-01 DIAGNOSIS — Z01419 Encounter for gynecological examination (general) (routine) without abnormal findings: Secondary | ICD-10-CM | POA: Diagnosis not present

## 2015-12-07 ENCOUNTER — Other Ambulatory Visit: Payer: Self-pay | Admitting: Internal Medicine

## 2015-12-21 DIAGNOSIS — R42 Dizziness and giddiness: Secondary | ICD-10-CM | POA: Diagnosis not present

## 2015-12-21 DIAGNOSIS — R195 Other fecal abnormalities: Secondary | ICD-10-CM | POA: Diagnosis not present

## 2015-12-22 DIAGNOSIS — K921 Melena: Secondary | ICD-10-CM | POA: Diagnosis not present

## 2015-12-22 DIAGNOSIS — K559 Vascular disorder of intestine, unspecified: Secondary | ICD-10-CM | POA: Diagnosis not present

## 2015-12-22 DIAGNOSIS — R195 Other fecal abnormalities: Secondary | ICD-10-CM | POA: Diagnosis not present

## 2015-12-29 DIAGNOSIS — R7309 Other abnormal glucose: Secondary | ICD-10-CM | POA: Diagnosis not present

## 2016-01-01 ENCOUNTER — Encounter: Payer: Self-pay | Admitting: Internal Medicine

## 2016-01-01 ENCOUNTER — Ambulatory Visit (INDEPENDENT_AMBULATORY_CARE_PROVIDER_SITE_OTHER): Payer: Medicare Other | Admitting: Internal Medicine

## 2016-01-01 VITALS — BP 108/72 | HR 74 | Ht 62.0 in | Wt 152.2 lb

## 2016-01-01 DIAGNOSIS — I952 Hypotension due to drugs: Secondary | ICD-10-CM

## 2016-01-01 DIAGNOSIS — Z950 Presence of cardiac pacemaker: Secondary | ICD-10-CM

## 2016-01-01 DIAGNOSIS — I442 Atrioventricular block, complete: Secondary | ICD-10-CM

## 2016-01-01 DIAGNOSIS — I350 Nonrheumatic aortic (valve) stenosis: Secondary | ICD-10-CM

## 2016-01-01 NOTE — Progress Notes (Signed)
Electrophysiology Office Note   Date:  01/01/2016   ID:  [name] [name], DOB July 24, 1920, MRN [id number]  PCP:  Mathews Argyle, MD  Cardiologist * Primary Electrophysiologist:    Virl Axe, MD    No chief complaint on file.    History of Present Illness: [name] [name] is a 82 y.o. female   seen in followup for ischemic heart disease with a modest depression of LV function. She is status post bypass surgery in 2002 and catheterization in 2007 demonstrating three out of four grafts patent; ejection fraction at that time was normal. Echocardiogram 2012 demonstrated normal left ventricular function with very mild aortic stenosis    She has a history of complete heart block and is status post pacemaker implantation . She also has a history of atrial tachycardia.  Echocardiogram 6/16 demonstrated normal LV function    The patient denies chest pain, shortness of breath, nocturnal dyspnea, orthopnea or peripheral edema. There have been no palpitations, lightheadedness or syncope.   her husband has recently been moved to assisted living. She sees him every day at 2:00        Past Medical History:  Diagnosis Date  . Anxiety     on med bid since 2007  . CAD (coronary artery disease)   . Carotid arterial disease (HCC)    sp cae   . Complete heart block ([place])   . Decreased hearing    left ear  . Hyperkalemia   . Hyperlipidemia   . LV dysfunction    intercurrent normalization of lv function- cath 2007  . Macular degeneration    impaired vision  . Mitral regurgitation    moderate  . Osteoarthritis   . Pacemaker -[place]    Date implanted 1996; generator replacement 2010  . Syncope    Past Surgical History:  Procedure Laterality Date  . ABDOMINAL HYSTERECTOMY    . CAROTID ENDARTERECTOMY  1995  . CORONARY ANGIOPLASTY  2007  . CORONARY ARTERY BYPASS GRAFT  1995   x4  . [place] and replaced April 2010  . Rt knee surgery  05/2009   [facility]      Current Outpatient Prescriptions  Medication Sig Dispense Refill  . acetaminophen (TYLENOL) 325 MG tablet Take 650 mg by mouth every 6 (six) hours as needed.      Marland Kitchen aspirin 81 MG chewable tablet Chew 81 mg by mouth daily.      . carvedilol (COREG) 25 MG tablet Take 25 mg by mouth 2 (two) times daily with a meal.  6  . Cholecalciferol (VITAMIN D3) 5000 UNITS CAPS Take 1 capsule by mouth daily.    . furosemide (LASIX) 20 MG tablet TAKE 1 TABLET BY MOUTH ONCE DAILY 30 tablet 0  . isosorbide mononitrate (IMDUR) 30 MG 24 hr tablet Take 30 mg by mouth daily.    . meclizine (ANTIVERT) 12.5 MG tablet Take 12.5 mg by mouth 3 (three) times daily as needed for dizziness.    . Melatonin 5 MG TABS Take 1 tablet by mouth daily.     . Multiple Vitamins-Minerals (CENTRUM SILVER PO) Take 1 tablet by mouth daily.     . Multiple Vitamins-Minerals (ICAPS PO) Take 1 tablet by mouth daily.     Marland Kitchen omeprazole (PRILOSEC) 20 MG capsule Take 1 capsule (20 mg total) by mouth daily. 30 capsule 0  . oxybutynin (DITROPAN) 5 MG tablet Take 1 tablet by mouth 2 (two) times daily.    Marland Kitchen  polyethylene glycol powder (MIRALAX) powder Take by mouth daily. 1/4 tsp by mouth daily for constipation    . sertraline (ZOLOFT) 50 MG tablet Take 50 mg by mouth daily.    . traMADol (ULTRAM) 50 MG tablet Take 50 mg by mouth as needed for pain.    Marland Kitchen desonide (DESOWEN) 0.05 % ointment Apply 1 application topically daily.    Marland Kitchen triamcinolone cream (KENALOG) 0.1 % Apply 1 application topically daily.     No current facility-administered medications for this visit.     Allergies:   Hydrocodone and Ibuprofen   Social History:  The patient  reports that she has quit smoking. She has never used smokeless tobacco. She reports that she drinks alcohol.   Family History:  The patient's    family history includes Melanoma in her daughter; Other in her father and mother.    ROS:  Please see the history of present illness and past medical history    PHYSICAL EXAM: VS:  BP 108/72   Pulse 74   Ht 5\' 2"  (1.575 m)   Wt 152 lb 3.2 oz (69 kg)   BMI 27.84 kg/m  , BMI Body mass index is 27.84 kg/m. GEN: Well nourished, well developed, in no acute distress HEENT: normal Neck:  JVD flat, carotid bruits, or masses Cardiac: REGULAR RATE and RHYTHM ; * 2/6 MURMUR Back without  kyphosis; No CVAT Respiratory:  clear to auscultation bilaterally, normal work of breathing GI: soft, nontender, nondistended, + BS MS: no deformity or atrophy Extremities no clubbing cyanosis +  edema Skin: warm and dry,  device pocket is well healed without teathering Neuro:  Strength and sensation are intact Psych: euthymic mood, full affect  EKG:   *  Device interrogation is reviewed today in detail.  See PaceArt for details.   Recent Labs: No results found for requested labs within last 8760 hours.    Lipid Panel     Component Value Date/Time   CHOL 134 12/04/2011 1142   TRIG 141.0 12/04/2011 1142   HDL 48.90 12/04/2011 1142   CHOLHDL 3 12/04/2011 1142   VLDL 28.2 12/04/2011 1142   LDLCALC 57 12/04/2011 1142   LDLDIRECT 64.2 12/04/2011 1142     Wt Readings from Last 3 Encounters:  01/01/16 152 lb 3.2 oz (69 kg)  11/08/14 131 lb 3.2 oz (59.5 kg)  09/28/13 138 lb (62.6 kg)      Other studies Reviewed: Additional studies/ records that were reviewed today include: none     ASSESSMENT AND PLAN:   Complete heart block stable post pacing  Pacemaker-[employer] The patient's device was interrogated. The information was reviewed. No changes were made in the programming.   Aortic stenosis  Hypertension  BP stable  FUNCTIONAL stable  Continue current meds  BP on the low side  Will have her get BP monitor and will measure over next month  Target BP would be 120 as some lightheadedness  ( uses antivert in the shower)  No chest pain    Virl Axe, MD  01/01/2016 4:41 PM     [facility] [address] [place] [address] [phone] (office) [phone] (fax)

## 2016-01-01 NOTE — Patient Instructions (Addendum)
Medication Instructions: Your physician recommends that you continue on your current medications as directed. Please refer to the Current Medication list given to you today.   Labwork: NONE  Procedures/Testing: NONE  Follow-Up: Your physician recommends that you schedule a follow-up appointment in 1 Year with Dr. Caryl Comes. We will mail you a letter 2-3 months before your appointment time.   Remote monitoring is used to monitor your Pacemaker of ICD from home. This monitoring reduces the number of office visits required to check your device to one time per year. It allows [place] to keep an eye on the functioning of your device to ensure it is working properly. You are scheduled for a device check from home on 04/01/16. You may send your transmission at any time that day. If you have a wireless device, the transmission will be sent automatically. After your physician reviews your transmission, you will receive a postcard with your next transmission date.    Any Additional Special Instructions Will Be Listed Below (If Applicable).     If you need a refill on your cardiac medications before your next appointment, please call your pharmacy.

## 2016-01-03 ENCOUNTER — Encounter: Payer: Self-pay | Admitting: Internal Medicine

## 2016-01-04 ENCOUNTER — Other Ambulatory Visit: Payer: Self-pay | Admitting: Internal Medicine

## 2016-02-01 DIAGNOSIS — R195 Other fecal abnormalities: Secondary | ICD-10-CM | POA: Diagnosis not present

## 2016-02-08 DIAGNOSIS — H1031 Unspecified acute conjunctivitis, right eye: Secondary | ICD-10-CM | POA: Diagnosis not present

## 2016-02-08 DIAGNOSIS — J209 Acute bronchitis, unspecified: Secondary | ICD-10-CM | POA: Diagnosis not present

## 2016-02-13 DIAGNOSIS — R0982 Postnasal drip: Secondary | ICD-10-CM | POA: Diagnosis not present

## 2016-02-13 DIAGNOSIS — R300 Dysuria: Secondary | ICD-10-CM | POA: Diagnosis not present

## 2016-02-13 DIAGNOSIS — J3489 Other specified disorders of nose and nasal sinuses: Secondary | ICD-10-CM | POA: Diagnosis not present

## 2016-02-13 DIAGNOSIS — Z23 Encounter for immunization: Secondary | ICD-10-CM | POA: Diagnosis not present

## 2016-03-20 DIAGNOSIS — I350 Nonrheumatic aortic (valve) stenosis: Secondary | ICD-10-CM | POA: Diagnosis not present

## 2016-03-20 DIAGNOSIS — G301 Alzheimer's disease with late onset: Secondary | ICD-10-CM | POA: Diagnosis not present

## 2016-03-20 DIAGNOSIS — I5022 Chronic systolic (congestive) heart failure: Secondary | ICD-10-CM | POA: Diagnosis not present

## 2016-04-01 ENCOUNTER — Ambulatory Visit (INDEPENDENT_AMBULATORY_CARE_PROVIDER_SITE_OTHER): Payer: Medicare Other | Admitting: *Deleted

## 2016-04-01 DIAGNOSIS — I442 Atrioventricular block, complete: Secondary | ICD-10-CM | POA: Diagnosis not present

## 2016-04-01 LAB — CUP PACEART REMOTE DEVICE CHECK
Battery Remaining Longevity: 63 mo
Battery Remaining Percentage: 51 %
Brady Statistic RA Percent Paced: 1.8 %
Date Time Interrogation Session: 20171030114637
Implantable Lead Location: 753860
Lead Channel Impedance Value: 390 Ohm
Lead Channel Pacing Threshold Pulse Width: 0.8 ms
Lead Channel Sensing Intrinsic Amplitude: 12 mV
Lead Channel Sensing Intrinsic Amplitude: 2.7 mV
Lead Channel Setting Pacing Amplitude: 2.5 V
MDC IDC LEAD IMPLANT DT: 19961115
MDC IDC LEAD IMPLANT DT: 19961115
MDC IDC LEAD LOCATION: 753859
MDC IDC MSMT BATTERY VOLTAGE: 2.87 V
MDC IDC MSMT LEADCHNL RA PACING THRESHOLD AMPLITUDE: 1.25 V
MDC IDC MSMT LEADCHNL RV IMPEDANCE VALUE: 630 Ohm
MDC IDC MSMT LEADCHNL RV PACING THRESHOLD AMPLITUDE: 0.625 V
MDC IDC MSMT LEADCHNL RV PACING THRESHOLD PULSEWIDTH: 0.6 ms
MDC IDC PG IMPLANT DT: 20100414
MDC IDC SET LEADCHNL RV PACING AMPLITUDE: 0.875
MDC IDC SET LEADCHNL RV PACING PULSEWIDTH: 0.6 ms
MDC IDC SET LEADCHNL RV SENSING SENSITIVITY: 2 mV
MDC IDC STAT BRADY AP VP PERCENT: 2 %
MDC IDC STAT BRADY AP VS PERCENT: 1 %
MDC IDC STAT BRADY AS VP PERCENT: 98 %
MDC IDC STAT BRADY AS VS PERCENT: 1 %
MDC IDC STAT BRADY RV PERCENT PACED: 99 %
Pulse Gen Model: 2110
Pulse Gen Serial Number: 2270306

## 2016-04-01 NOTE — Progress Notes (Signed)
Remote pacemaker transmission.   

## 2016-04-05 ENCOUNTER — Encounter: Payer: Self-pay | Admitting: Cardiology

## 2016-05-04 DIAGNOSIS — J209 Acute bronchitis, unspecified: Secondary | ICD-10-CM | POA: Diagnosis not present

## 2016-05-04 DIAGNOSIS — J069 Acute upper respiratory infection, unspecified: Secondary | ICD-10-CM | POA: Diagnosis not present

## 2016-07-01 ENCOUNTER — Ambulatory Visit (INDEPENDENT_AMBULATORY_CARE_PROVIDER_SITE_OTHER): Payer: Medicare Other | Admitting: *Deleted

## 2016-07-01 ENCOUNTER — Telehealth: Payer: Self-pay | Admitting: Cardiology

## 2016-07-01 DIAGNOSIS — I442 Atrioventricular block, complete: Secondary | ICD-10-CM | POA: Diagnosis not present

## 2016-07-01 NOTE — Telephone Encounter (Signed)
LMOVM reminding pt to send remote transmission.   

## 2016-07-02 ENCOUNTER — Encounter: Payer: Self-pay | Admitting: Cardiology

## 2016-07-02 NOTE — Progress Notes (Signed)
Remote pacemaker transmission.   

## 2016-07-05 LAB — CUP PACEART REMOTE DEVICE CHECK
Battery Remaining Longevity: 62 mo
Battery Remaining Percentage: 51 %
Brady Statistic AP VS Percent: 1 %
Brady Statistic AS VP Percent: 98 %
Brady Statistic AS VS Percent: 1 %
Date Time Interrogation Session: 20180129230756
Implantable Lead Implant Date: 19961115
Implantable Lead Location: 753859
Lead Channel Impedance Value: 340 Ohm
Lead Channel Pacing Threshold Amplitude: 0.75 V
Lead Channel Pacing Threshold Pulse Width: 0.6 ms
Lead Channel Sensing Intrinsic Amplitude: 12 mV
Lead Channel Sensing Intrinsic Amplitude: 3.4 mV
Lead Channel Setting Pacing Amplitude: 1 V
Lead Channel Setting Pacing Amplitude: 2.5 V
Lead Channel Setting Pacing Pulse Width: 0.6 ms
MDC IDC LEAD IMPLANT DT: 19961115
MDC IDC LEAD LOCATION: 753860
MDC IDC MSMT BATTERY VOLTAGE: 2.87 V
MDC IDC MSMT LEADCHNL RA PACING THRESHOLD AMPLITUDE: 1.25 V
MDC IDC MSMT LEADCHNL RA PACING THRESHOLD PULSEWIDTH: 0.8 ms
MDC IDC MSMT LEADCHNL RV IMPEDANCE VALUE: 610 Ohm
MDC IDC PG IMPLANT DT: 20100414
MDC IDC SET LEADCHNL RV SENSING SENSITIVITY: 2 mV
MDC IDC STAT BRADY AP VP PERCENT: 1.8 %
MDC IDC STAT BRADY RA PERCENT PACED: 1.5 %
MDC IDC STAT BRADY RV PERCENT PACED: 99 %
Pulse Gen Serial Number: 2270306

## 2016-07-08 DIAGNOSIS — R109 Unspecified abdominal pain: Secondary | ICD-10-CM | POA: Diagnosis not present

## 2016-07-11 ENCOUNTER — Other Ambulatory Visit: Payer: Self-pay | Admitting: Geriatric Medicine

## 2016-07-11 DIAGNOSIS — R1011 Right upper quadrant pain: Secondary | ICD-10-CM | POA: Diagnosis not present

## 2016-07-12 ENCOUNTER — Ambulatory Visit (HOSPITAL_COMMUNITY): Payer: Medicare Other

## 2016-07-12 ENCOUNTER — Ambulatory Visit (HOSPITAL_COMMUNITY)
Admission: RE | Admit: 2016-07-12 | Discharge: 2016-07-12 | Disposition: A | Discharge status: Home / Self Care | Payer: Medicare Other | Source: Ambulatory Visit | Attending: Geriatric Medicine | Admitting: Geriatric Medicine

## 2016-07-12 DIAGNOSIS — K828 Other specified diseases of gallbladder: Secondary | ICD-10-CM | POA: Insufficient documentation

## 2016-07-12 DIAGNOSIS — R1011 Right upper quadrant pain: Secondary | ICD-10-CM | POA: Diagnosis not present

## 2016-07-12 DIAGNOSIS — K835 Biliary cyst: Secondary | ICD-10-CM | POA: Insufficient documentation

## 2016-07-13 ENCOUNTER — Emergency Department (HOSPITAL_COMMUNITY)
Admission: EM | Admit: 2016-07-13 | Discharge: 2016-07-13 | Disposition: A | Discharge status: Home / Self Care | Payer: Medicare Other | Attending: Emergency Medicine | Admitting: Emergency Medicine

## 2016-07-13 ENCOUNTER — Encounter (HOSPITAL_COMMUNITY): Payer: Self-pay

## 2016-07-13 DIAGNOSIS — R55 Syncope and collapse: Secondary | ICD-10-CM

## 2016-07-13 DIAGNOSIS — I10 Essential (primary) hypertension: Secondary | ICD-10-CM | POA: Insufficient documentation

## 2016-07-13 DIAGNOSIS — I2510 Atherosclerotic heart disease of native coronary artery without angina pectoris: Secondary | ICD-10-CM | POA: Diagnosis not present

## 2016-07-13 DIAGNOSIS — Z87891 Personal history of nicotine dependence: Secondary | ICD-10-CM | POA: Diagnosis not present

## 2016-07-13 DIAGNOSIS — Z951 Presence of aortocoronary bypass graft: Secondary | ICD-10-CM | POA: Diagnosis not present

## 2016-07-13 DIAGNOSIS — Z950 Presence of cardiac pacemaker: Secondary | ICD-10-CM | POA: Diagnosis not present

## 2016-07-13 DIAGNOSIS — R404 Transient alteration of awareness: Secondary | ICD-10-CM | POA: Diagnosis not present

## 2016-07-13 LAB — CBC
HEMATOCRIT: 35.1 % — AB (ref 36.0–46.0)
Hemoglobin: 11.5 g/dL — ABNORMAL LOW (ref 12.0–15.0)
MCH: 30.8 pg (ref 26.0–34.0)
MCHC: 32.8 g/dL (ref 30.0–36.0)
MCV: 94.1 fL (ref 78.0–100.0)
PLATELETS: 243 10*3/uL (ref 150–400)
RBC: 3.73 MIL/uL — AB (ref 3.87–5.11)
RDW: 12.4 % (ref 11.5–15.5)
WBC: 8.3 10*3/uL (ref 4.0–10.5)

## 2016-07-13 LAB — URINALYSIS, ROUTINE W REFLEX MICROSCOPIC
Bilirubin Urine: NEGATIVE
GLUCOSE, UA: NEGATIVE mg/dL
Hgb urine dipstick: NEGATIVE
Ketones, ur: NEGATIVE mg/dL
Leukocytes, UA: NEGATIVE
Nitrite: NEGATIVE
PH: 7 (ref 5.0–8.0)
Protein, ur: NEGATIVE mg/dL
SPECIFIC GRAVITY, URINE: 1.009 (ref 1.005–1.030)

## 2016-07-13 LAB — BASIC METABOLIC PANEL
Anion gap: 8 (ref 5–15)
BUN: 21 mg/dL — ABNORMAL HIGH (ref 6–20)
CHLORIDE: 100 mmol/L — AB (ref 101–111)
CO2: 29 mmol/L (ref 22–32)
CREATININE: 0.93 mg/dL (ref 0.44–1.00)
Calcium: 8.5 mg/dL — ABNORMAL LOW (ref 8.9–10.3)
GFR calc non Af Amer: 51 mL/min — ABNORMAL LOW (ref >60)
GFR, EST AFRICAN AMERICAN: 60 mL/min — AB (ref >60)
Glucose, Bld: 106 mg/dL — ABNORMAL HIGH (ref 65–99)
Potassium: 4.4 mmol/L (ref 3.5–5.1)
Sodium: 137 mmol/L (ref 135–145)

## 2016-07-13 LAB — I-STAT TROPONIN, ED: Troponin i, poc: 0 ng/mL (ref 0.00–0.08)

## 2016-07-13 LAB — CBG MONITORING, ED: Glucose-Capillary: 109 mg/dL — ABNORMAL HIGH (ref 65–99)

## 2016-07-13 NOTE — ED Provider Notes (Signed)
Not in room   Pattricia Boss, MD 07/13/16 1434

## 2016-07-13 NOTE — ED Triage Notes (Signed)
To room via EMS.  Onset around 1:10p pt was sitting at counter talking to daughter on phone, reported to daughter that she didn't feel good.  Another daughter arrived right after 1;10p and found pt with hands on counter spread out and then had LOC x 1-2 min. No injuries.  Pt had 2 episodes of diarrhea this morning (black in color had pepto bismol this mornng).  Pt had LOC episode 2 weeks ago while getting pedicare that lasted 2 min.  Pt has been seeing Dr. Felipa Eth for past 2 weeks for abd pain.  Has u/s yesterday.  Pt is on Keflex for UTI, last dose tomorrow.  Per EMS pts BP sitting 130/70, standing 102 palp.

## 2016-07-13 NOTE — ED Provider Notes (Signed)
[facility] DEPT Provider Note   CSN: [id number] Arrival date & time: 07/13/16  1427     History   Chief Complaint Chief Complaint  Patient presents with  . Loss of Consciousness    HPI [name] [name] is a 83 y.o. female.  The history is provided by the patient and a relative.  Loss of Consciousness   This is a new problem. The current episode started 3 to 5 hours ago. The problem has been resolved. She lost consciousness for a period of 1 to 5 minutes. The problem is associated with bowel movements (just prior to onset of LOC). Pertinent negatives include bladder incontinence, bowel incontinence, confusion, fever and seizures. She has tried nothing for the symptoms. Her past medical history does not include seizures.    Past Medical History:  Diagnosis Date  . Anxiety     on med bid since 2007  . CAD (coronary artery disease)   . Carotid arterial disease (HCC)    sp cae   . Complete heart block ([place])   . Decreased hearing    left ear  . Hyperkalemia   . Hyperlipidemia   . LV dysfunction    intercurrent normalization of lv function- cath 2007  . Macular degeneration    impaired vision  . Mitral regurgitation    moderate  . Osteoarthritis   . Pacemaker -[place]    Date implanted 1996; generator replacement 2010  . Syncope     Patient Active Problem List   Diagnosis Date Noted  . Syncope 05/27/2011  . Lower back pain 11/19/2010  . Carotid arterial disease ([place])   . AV BLOCK, COMPLETE 06/05/2010  . ADJ DISORDER WITH MIXED ANXIETY & DEPRESSED MOOD 04/12/2010  . HYPERLIPIDEMIA 09/29/2009  . ATRIAL TACHYCARDIA 09/12/2009  . AORTIC VALVE DISORDERS 01/31/2009  . ADJUSTMENT DISORDER WITH ANXIETY 06/21/2008  . PACEMAKER-[employer] 06/10/2007  . CORONARY ARTERY BYPASS GRAFT, HX OF 06/10/2007  . HYPERTENSION 12/24/2006  . CORONARY ARTERY DISEASE 12/24/2006  . OSTEOARTHRITIS 12/24/2006    Past Surgical History:  Procedure Laterality Date  . ABDOMINAL  HYSTERECTOMY    . CAROTID ENDARTERECTOMY  1995  . CORONARY ANGIOPLASTY  2007  . CORONARY ARTERY BYPASS GRAFT  1995   x4  . Cutter and replaced April 2010  . Rt knee surgery  05/2009   [facility]    OB History    No data available       Home Medications    Prior to Admission medications   Medication Sig Start Date End Date Taking? Authorizing Provider  acetaminophen (TYLENOL) 325 MG tablet Take 650 mg by mouth every 6 (six) hours as needed.     Yes Historical Provider, MD  carvedilol (COREG) 25 MG tablet Take 25 mg by mouth 2 (two) times daily with a meal. 10/20/14  Yes Historical Provider, MD  cephALEXin (KEFLEX) 500 MG capsule Take 500 mg by mouth 2 (two) times daily. 07/08/16  Yes Historical Provider, MD  Cholecalciferol (VITAMIN D3) 5000 UNITS CAPS Take 1 capsule by mouth daily.   Yes Historical Provider, MD  Cyanocobalamin (B-12 PO) Take 1 tablet by mouth daily.   Yes Historical Provider, MD  furosemide (LASIX) 20 MG tablet TAKE 1 TABLET BY MOUTH ONCE DAILY 01/04/16  Yes Deboraha Sprang, MD  isosorbide mononitrate (IMDUR) 30 MG 24 hr tablet Take 15 mg by mouth daily.    Yes Historical Provider, MD  meclizine (ANTIVERT) 12.5 MG tablet Take 12.5 mg by mouth  3 (three) times daily as needed for dizziness.   Yes Historical Provider, MD  Melatonin 5 MG TABS Take 1 tablet by mouth daily.    Yes Historical Provider, MD  Multiple Vitamins-Minerals (CENTRUM SILVER PO) Take 1 tablet by mouth daily.    Yes Historical Provider, MD  Multiple Vitamins-Minerals (ICAPS PO) Take 1 tablet by mouth daily.    Yes Historical Provider, MD  omeprazole (PRILOSEC) 20 MG capsule Take 1 capsule (20 mg total) by mouth daily. Patient taking differently: Take 20 mg by mouth 2 (two) times daily before a meal.  08/29/12  Yes Jola Schmidt, MD  polyethylene glycol powder [facility]) powder Take by mouth daily. 1/4 tsp by mouth daily for constipation   Yes Historical Provider, MD  sertraline (ZOLOFT) 50 MG  tablet Take 50 mg by mouth at bedtime.    Yes Historical Provider, MD  Turmeric Curcumin 500 MG CAPS Take 500 mg by mouth daily.   Yes Historical Provider, MD    Family History Family History  Problem Relation Age of Onset  . Melanoma Daughter   . Other Mother     cerebral hemorrage  . Other Father     grangerene in one leg    Social History Social History  Substance Use Topics  . Smoking status: Former Research scientist (life sciences)  . Smokeless tobacco: Never Used     Comment: pt stopped a long time ago  . Alcohol use No     Allergies   Aspirin; Ibuprofen; and Hydrocodone   Review of Systems Review of Systems  Constitutional: Negative for fever.  Cardiovascular: Positive for syncope.  Gastrointestinal: Negative for bowel incontinence.  Genitourinary: Negative for bladder incontinence.  Neurological: Negative for seizures.  Psychiatric/Behavioral: Negative for confusion.  All other systems reviewed and are negative.    Physical Exam Updated Vital Signs BP 180/71 (BP Location: Right Arm)   Pulse 70   Temp 97.7 F (36.5 C) (Oral)   Resp 21   SpO2 96%   Physical Exam  Constitutional: She is oriented to person, place, and time. She appears well-developed and well-nourished. No distress.  HENT:  Head: Normocephalic.  Nose: Nose normal.  Eyes: Conjunctivae are normal.  Neck: Neck supple. No tracheal deviation present.  Cardiovascular: Normal rate and regular rhythm.   Murmur (2/6 systolic) heard. Pulmonary/Chest: Effort normal and breath sounds normal. No respiratory distress.  Abdominal: Soft. She exhibits no distension. There is no tenderness. There is no rebound and no guarding.  Neurological: She is alert and oriented to person, place, and time.  Skin: Skin is warm and dry.  Psychiatric: She has a normal mood and affect.     ED Treatments / Results  Labs (all labs ordered are listed, but only abnormal results are displayed) Labs Reviewed  BASIC METABOLIC PANEL - Abnormal;  Notable for the following:       Result Value   Chloride 100 (*)    Glucose, Bld 106 (*)    BUN 21 (*)    Calcium 8.5 (*)    GFR calc non Af Amer 51 (*)    GFR calc Af Amer 60 (*)    All other components within normal limits  CBC - Abnormal; Notable for the following:    RBC 3.73 (*)    Hemoglobin 11.5 (*)    HCT 35.1 (*)    All other components within normal limits  CBG MONITORING, ED - Abnormal; Notable for the following:    Glucose-Capillary 109 (*)  All other components within normal limits  URINALYSIS, ROUTINE W REFLEX MICROSCOPIC    EKG  EKG Interpretation  Date/Time:  Saturday July 13 2016 14:55:57 EST Ventricular Rate:  66 PR Interval:    QRS Duration: 167 QT Interval:  476 QTC Calculation: 499 R Axis:   -73 Text Interpretation:  Sinus rhythm Ventricular premature complex Left bundle branch block No significant change since last tracing Confirmed by RAY MD, Andee Poles [phone]) on 07/13/2016 2:59:19 PM       Radiology US Abdomen Limited Ruq  Result Date: 07/12/2016 CLINICAL DATA:  RIGHT upper quadrant pain for about 1 week, history coronary artery disease, hypertension, hyperlipidemia, valvular heart disease, former smoker EXAM: US ABDOMEN LIMITED - RIGHT UPPER QUADRANT COMPARISON:  CT abdomen and pelvis 01/24/2014 FINDINGS: Gallbladder: Dependent sludge in gallbladder. No shadowing calculi. No wall thickening or pericholecystic fluid. No sonographic Murphy sign. Common bile duct: Diameter: Dilated, 10 mm diameter unchanged from prior CT, could potentially be related to patient age Liver: Normal appearance. No intrahepatic biliary dilatation or hepatic mass. No RIGHT upper quadrant free fluid IMPRESSION: Dependent sludge within gallbladder without gallstones or gallbladder wall thickening. Chronic CBD dilatation up to 10 mm diameter, unchanged from CT exam of 2015, question related to patient age Electronically Signed   By: Lavonia Dana M.D.   On: 07/12/2016 10:57     Procedures Procedures (including critical care time)  Medications Ordered in ED Medications - No data to display   Initial Impression / Assessment and Plan / ED Course  I have reviewed the triage vital signs and the nursing notes.  Pertinent labs & imaging results that were available during my care of the patient were reviewed by me and considered in my medical decision making (see chart for details).     83 y.o. female presents with Syncopal episode today that occurred after having a few loose bowel movements earlier today. She had one episode previously that was similar at the nail salon. She is in her typical paced rhythm here, pacer interrogated with no suspicion of arrhythmia to explain symptoms. Well appearing and back to baseline. Has had issues with abdominal cramping and difficulty with bowel movements which appears to have contributed to a vagal event but no indication for admission given her good clinical appearance now. Plan to follow up with PCP as needed and return precautions discussed for worsening or new concerning symptoms.   Final Clinical Impressions(s) / ED Diagnoses   Final diagnoses:  Vasovagal syncope    New Prescriptions New Prescriptions   No medications on file     Leo Grosser, MD 07/14/16 [phone]

## 2016-07-13 NOTE — ED Notes (Signed)
[place] with [employer] called to report no episodes to report on the interrogation.

## 2016-07-13 NOTE — ED Notes (Signed)
[employer] interrogation sent.

## 2016-07-19 ENCOUNTER — Other Ambulatory Visit: Payer: Medicare Other

## 2016-07-26 DIAGNOSIS — R55 Syncope and collapse: Secondary | ICD-10-CM | POA: Diagnosis not present

## 2016-07-26 DIAGNOSIS — N183 Chronic kidney disease, stage 3 (moderate): Secondary | ICD-10-CM | POA: Diagnosis not present

## 2016-07-26 DIAGNOSIS — I25810 Atherosclerosis of coronary artery bypass graft(s) without angina pectoris: Secondary | ICD-10-CM | POA: Diagnosis not present

## 2016-07-26 DIAGNOSIS — M549 Dorsalgia, unspecified: Secondary | ICD-10-CM | POA: Diagnosis not present

## 2016-08-07 DIAGNOSIS — H52223 Regular astigmatism, bilateral: Secondary | ICD-10-CM | POA: Diagnosis not present

## 2016-08-07 DIAGNOSIS — H01001 Unspecified blepharitis right upper eyelid: Secondary | ICD-10-CM | POA: Diagnosis not present

## 2016-08-07 DIAGNOSIS — H524 Presbyopia: Secondary | ICD-10-CM | POA: Diagnosis not present

## 2016-08-07 DIAGNOSIS — H353122 Nonexudative age-related macular degeneration, left eye, intermediate dry stage: Secondary | ICD-10-CM | POA: Diagnosis not present

## 2016-08-07 DIAGNOSIS — H5213 Myopia, bilateral: Secondary | ICD-10-CM | POA: Diagnosis not present

## 2016-08-07 DIAGNOSIS — H01002 Unspecified blepharitis right lower eyelid: Secondary | ICD-10-CM | POA: Diagnosis not present

## 2016-08-07 DIAGNOSIS — H26491 Other secondary cataract, right eye: Secondary | ICD-10-CM | POA: Diagnosis not present

## 2016-08-07 DIAGNOSIS — H04123 Dry eye syndrome of bilateral lacrimal glands: Secondary | ICD-10-CM | POA: Diagnosis not present

## 2016-08-07 DIAGNOSIS — H353111 Nonexudative age-related macular degeneration, right eye, early dry stage: Secondary | ICD-10-CM | POA: Diagnosis not present

## 2016-08-16 ENCOUNTER — Other Ambulatory Visit: Payer: Self-pay | Admitting: Geriatric Medicine

## 2016-08-16 DIAGNOSIS — I350 Nonrheumatic aortic (valve) stenosis: Secondary | ICD-10-CM

## 2016-08-16 DIAGNOSIS — I5022 Chronic systolic (congestive) heart failure: Secondary | ICD-10-CM | POA: Diagnosis not present

## 2016-08-16 DIAGNOSIS — I700 Atherosclerosis of aorta: Secondary | ICD-10-CM | POA: Diagnosis not present

## 2016-08-16 DIAGNOSIS — Z0000 Encounter for general adult medical examination without abnormal findings: Secondary | ICD-10-CM | POA: Diagnosis not present

## 2016-08-16 DIAGNOSIS — Z1389 Encounter for screening for other disorder: Secondary | ICD-10-CM | POA: Diagnosis not present

## 2016-08-16 DIAGNOSIS — F325 Major depressive disorder, single episode, in full remission: Secondary | ICD-10-CM | POA: Diagnosis not present

## 2016-08-16 DIAGNOSIS — N183 Chronic kidney disease, stage 3 (moderate): Secondary | ICD-10-CM | POA: Diagnosis not present

## 2016-08-16 DIAGNOSIS — G301 Alzheimer's disease with late onset: Secondary | ICD-10-CM | POA: Diagnosis not present

## 2016-08-23 DIAGNOSIS — R300 Dysuria: Secondary | ICD-10-CM | POA: Diagnosis not present

## 2016-09-02 ENCOUNTER — Ambulatory Visit (HOSPITAL_COMMUNITY): Payer: Medicare Other | Attending: Cardiovascular Disease

## 2016-09-02 ENCOUNTER — Encounter: Payer: Self-pay | Admitting: Cardiology

## 2016-09-02 ENCOUNTER — Ambulatory Visit (INDEPENDENT_AMBULATORY_CARE_PROVIDER_SITE_OTHER): Payer: Medicare Other | Admitting: Cardiology

## 2016-09-02 ENCOUNTER — Telehealth: Payer: Self-pay | Admitting: Internal Medicine

## 2016-09-02 ENCOUNTER — Other Ambulatory Visit: Payer: Self-pay

## 2016-09-02 VITALS — BP 120/70 | HR 73 | Ht 62.0 in | Wt 147.0 lb

## 2016-09-02 DIAGNOSIS — Z951 Presence of aortocoronary bypass graft: Secondary | ICD-10-CM

## 2016-09-02 DIAGNOSIS — I952 Hypotension due to drugs: Secondary | ICD-10-CM

## 2016-09-02 DIAGNOSIS — R55 Syncope and collapse: Secondary | ICD-10-CM

## 2016-09-02 DIAGNOSIS — I2510 Atherosclerotic heart disease of native coronary artery without angina pectoris: Secondary | ICD-10-CM

## 2016-09-02 DIAGNOSIS — Z950 Presence of cardiac pacemaker: Secondary | ICD-10-CM | POA: Insufficient documentation

## 2016-09-02 DIAGNOSIS — I350 Nonrheumatic aortic (valve) stenosis: Secondary | ICD-10-CM

## 2016-09-02 DIAGNOSIS — I442 Atrioventricular block, complete: Secondary | ICD-10-CM | POA: Diagnosis not present

## 2016-09-02 DIAGNOSIS — I420 Dilated cardiomyopathy: Secondary | ICD-10-CM | POA: Diagnosis not present

## 2016-09-02 DIAGNOSIS — I340 Nonrheumatic mitral (valve) insufficiency: Secondary | ICD-10-CM | POA: Insufficient documentation

## 2016-09-02 DIAGNOSIS — I422 Other hypertrophic cardiomyopathy: Secondary | ICD-10-CM | POA: Insufficient documentation

## 2016-09-02 NOTE — Patient Instructions (Signed)
Medication Instructions:   STOP TAKING IMDUR NOW    Follow-Up:  IN 7 WEEKS WITH DR NELSON--PLEASE ADD TO AN END SLOT ON ONE OF DR NELSON'S QUARTER DAYS       If you need a refill on your cardiac medications before your next appointment, please call your pharmacy.

## 2016-09-02 NOTE — Telephone Encounter (Signed)
On Friday, pt's daughter states pt had just walked in to bathroom to take a shower, pt found herself on the floor. Pt's daughter states pt had no warning before she passed out, she hit her head beside her right eye, has a black right eye. Pt's daughter states pt has alzheimer's, it is hard to tell if change in mental status, but has not noted any significant changes. Pt's daughter advised to send in remote transmission, then will review with DOD.

## 2016-09-02 NOTE — Progress Notes (Signed)
Cardiology Office Note    Date:  09/02/2016   ID:  [name] [name], DOB 26-Nov-1920, MRN [id number]  PCP:  Mathews Argyle, MD  Cardiologist:  Ena Dawley, MD  Electrophysiologist: Dr Caryl Comes  No chief complaint on file.   History of Present Illness:  [name] [name] is a 83 y.o. female with h/o CAD, Status post CABG 1995, atrial tachycardia, complete heart block and status post pacemaker placement followed by Dr. Caryl Comes. Cardiac catheterization in 2007 demonstrating three out of four grafts patent; ejection fraction at that time was normal. Echocardiogram 2016 demonstrated normal left ventricular function with very mild aortic stenosis. The patient has been living with her daughter and intermittently goes to her house during the day. She is coming after an episode of fall last week. It is happen while in the shower. The patient has no recollection of this episodes, she doesn't remember any palpitations or chest pain prior to this episode and she was found by her daughter, it is unknown how long she has been down. This is the first episode of syncope since prior pacemaker placement. She is fairly active walks every day, she denies any chest pain exertional dyspnea, no lower extremity edema orthopnea or proximal nocturnal dyspnea.  Past Medical History:  Diagnosis Date  . Anxiety     on med bid since 2007  . CAD (coronary artery disease)   . Carotid arterial disease (HCC)    sp cae   . Complete heart block ([place])   . Decreased hearing    left ear  . Hyperkalemia   . Hyperlipidemia   . LV dysfunction    intercurrent normalization of lv function- cath 2007  . Macular degeneration    impaired vision  . Mitral regurgitation    moderate  . Osteoarthritis   . Pacemaker -[place]    Date implanted 1996; generator replacement 2010  . Syncope     Past Surgical History:  Procedure Laterality Date  . ABDOMINAL HYSTERECTOMY    . CAROTID ENDARTERECTOMY  1995  . CORONARY  ANGIOPLASTY  2007  . CORONARY ARTERY BYPASS GRAFT  1995   x4  . [place] and replaced April 2010  . Rt knee surgery  05/2009   [facility]    Current Medications: Outpatient Medications Prior to Visit  Medication Sig Dispense Refill  . acetaminophen (TYLENOL) 325 MG tablet Take 650 mg by mouth every 6 (six) hours as needed.      . carvedilol (COREG) 25 MG tablet Take 25 mg by mouth 2 (two) times daily with a meal.  6  . cephALEXin (KEFLEX) 500 MG capsule Take 500 mg by mouth 2 (two) times daily.    . Cholecalciferol (VITAMIN D3) 5000 UNITS CAPS Take 1 capsule by mouth daily.    . Cyanocobalamin (B-12 PO) Vitamin B12, 1 tablet by mouth daily    . furosemide (LASIX) 20 MG tablet TAKE 1 TABLET BY MOUTH ONCE DAILY 90 tablet 3  . meclizine (ANTIVERT) 12.5 MG tablet Take 12.5 mg by mouth 3 (three) times daily as needed for dizziness.    . Melatonin 5 MG TABS Take 2 tablets by mouth daily.     . Multiple Vitamins-Minerals (CENTRUM SILVER PO) Take 1 tablet by mouth daily.     Marland Kitchen omeprazole (PRILOSEC) 20 MG capsule Take 1 capsule (20 mg total) by mouth daily. (Patient taking differently: Take 20 mg by mouth 2 (two) times daily before a meal. ) 30 capsule 0  .  polyethylene glycol powder (MIRALAX) powder Take by mouth daily. 1/4 tsp by mouth daily for constipation    . sertraline (ZOLOFT) 50 MG tablet Take 50 mg by mouth at bedtime.     . Turmeric Curcumin 500 MG CAPS Take 500 mg by mouth daily.    . isosorbide mononitrate (IMDUR) 30 MG 24 hr tablet Take 15 mg by mouth daily.     . Multiple Vitamins-Minerals (ICAPS PO) Take 1 tablet by mouth daily.      No facility-administered medications prior to visit.      Allergies:   Aspirin; Ibuprofen; and Hydrocodone   Social History   Social History  . Marital status: Married    Spouse name: [name] [name]  . Number of children: [name] [name]  . Years of education: [name] [name]   Social History Main Topics  . Smoking status: Former Research scientist (life sciences)  . Smokeless tobacco:  Never Used     Comment: pt stopped a long time ago  . Alcohol use No  . Drug use: No  . Sexual activity: No   Other Topics Concern  . 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   ROS:   Please see the history of present illness.    ROS All other systems reviewed and are negative.   PHYSICAL EXAM:   VS:  BP 120/70   Pulse 73   Ht 5\' 2"  (1.575 m)   Wt 147 lb (66.7 kg)   SpO2 91%   BMI 26.89 kg/m    GEN: Well nourished, well developed, in no acute distress  HEENT: normal  Neck: no JVD, carotid bruits, or masses, right eye bruised Cardiac: RRR; 3/6 systolic murmurs, S2 present, rubs, or gallops,no edema  Respiratory:  clear to auscultation bilaterally, normal work of breathing GI: soft, nontender, nondistended, + BS MS: no deformity or atrophy  Skin: warm and dry, no rash Neuro:  Alert and Oriented x 3, Strength and sensation are intact Psych: euthymic mood, full affect  Wt Readings from Last 3 Encounters:  09/02/16 147 lb (66.7 kg)  01/01/16 152 lb 3.2 oz (69 kg)  11/08/14 131 lb 3.2 oz (59.5 kg)     Studies/Labs Reviewed:   EKG:  EKG is not ordered today.    Recent Labs: 07/13/2016: BUN 21; Creatinine, Ser 0.93; Hemoglobin 11.5; Platelets 243; Potassium 4.4; Sodium 137   Lipid Panel    Component Value Date/Time   CHOL 134 12/04/2011 1142   TRIG 141.0 12/04/2011 1142   HDL 48.90 12/04/2011 1142   CHOLHDL 3 12/04/2011 1142   VLDL 28.2 12/04/2011 1142   LDLCALC 57 12/04/2011 1142   LDLDIRECT 64.2 12/04/2011 1142    Additional studies/ records that were reviewed today include:   TTE: 09/02/2016 Left  ventricle: The cavity size was normal. There was mild focal   basal and moderate concentric hypertrophy of the septum. Systolic   function was normal. The estimated ejection fraction was in the   range of 55% to 60%. Wall motion was normal; there were no   regional wall  motion abnormalities. - Ventricular septum: Septal motion showed paradox. These changes   are consistent with right ventricular pacing. - Aortic valve: There was mild stenosis. Valve area (VTI): 1.52   cm^2. Valve area (Vmax): 1.68 cm^2. Valve area (Vmean): 1.7 cm^2. - Mitral valve: Calcified annulus. There was mild regurgitation   directed eccentrically and posteriorly. - Left atrium: The atrium was mildly dilated. - Pulmonary arteries: Systolic pressure was mildly increased. PA   peak pressure: 38 mm Hg (S).    ASSESSMENT:    1. Syncope, unspecified syncope type   2. Aortic valve stenosis, moderate   3. Atrioventricular block, complete (HCC)   4. Cardiac pacemaker in situ   5. Hypotension due to drugs   6. Coronary artery disease involving native coronary artery of native heart without angina pectoris   7. S/P CABG x 4      PLAN:  In order of problems listed above:  Syncope, patient's pacemaker was interrogated and is functioning normally there were no episodes of tachycardia or bradycardia. We will discontinue Imdur as her blood pressure is rather low and Imdur can be contributing to vasodilation that can be more pronounced On standing or while in the shower. I have personally reviewed her echocardiogram and her aortic stenosis is in the moderate range based on aortic valve area, however she has significant upper basal hypertrophy that might potentially cause obstruction of flow in addition to aortic stenosis. She is advised to stay hydrated during the day and continue taking carvedilol. We will follow-up in 4 weeks.  Medication Adjustments/Labs and Tests Ordered: Current medicines are reviewed at length with  the patient today.  Concerns regarding medicines are outlined above.  Medication changes, Labs and Tests ordered today are listed in the Patient Instructions below. Patient Instructions  Medication Instructions:   STOP TAKING IMDUR NOW    Follow-Up:  IN 7 WEEKS WITH DR Jaedyn Lard--PLEASE ADD TO AN END SLOT ON ONE OF DR Orpha Dain'S QUARTER DAYS       If you need a refill on your cardiac medications before your next appointment, please call your pharmacy.      Signed, Ena Dawley, MD  09/02/2016 4:57 PM    [address], [place], [place]  [address] Phone: [phone]; Fax: [phone]

## 2016-09-02 NOTE — Telephone Encounter (Signed)
New message       Pt c/o Syncope: STAT if syncope occurred within 30 minutes and pt complains of lightheadedness High Priority if episode of passing out, completely, today or in last 24 hours   Did you pass out today? no 1. When is the last time you passed out? Last friday  Has this occurred multiple times?  no 2. Did you have any symptoms prior to passing out?  No--- 3. Pt has a 1pm echo appt today.  Daughter want her to be seen before or after echo today.  Please call

## 2016-09-02 NOTE — Telephone Encounter (Signed)
Remote transmission normal per [facility].   I have reviewed with Dr Meda Coffee, Dr Meda Coffee will see pt today at 2:40 PM after echo scheduled for 1 PM, pt's daughter, Arbie Cookey aware.

## 2016-09-10 DIAGNOSIS — F325 Major depressive disorder, single episode, in full remission: Secondary | ICD-10-CM | POA: Diagnosis not present

## 2016-09-10 DIAGNOSIS — R55 Syncope and collapse: Secondary | ICD-10-CM | POA: Diagnosis not present

## 2016-09-10 DIAGNOSIS — J209 Acute bronchitis, unspecified: Secondary | ICD-10-CM | POA: Diagnosis not present

## 2016-09-10 DIAGNOSIS — I5022 Chronic systolic (congestive) heart failure: Secondary | ICD-10-CM | POA: Diagnosis not present

## 2016-09-10 DIAGNOSIS — R5383 Other fatigue: Secondary | ICD-10-CM | POA: Diagnosis not present

## 2016-09-10 DIAGNOSIS — G301 Alzheimer's disease with late onset: Secondary | ICD-10-CM | POA: Diagnosis not present

## 2016-09-10 DIAGNOSIS — N183 Chronic kidney disease, stage 3 (moderate): Secondary | ICD-10-CM | POA: Diagnosis not present

## 2016-09-30 ENCOUNTER — Telehealth: Payer: Self-pay | Admitting: Cardiology

## 2016-09-30 ENCOUNTER — Ambulatory Visit (INDEPENDENT_AMBULATORY_CARE_PROVIDER_SITE_OTHER): Payer: Medicare Other | Admitting: *Deleted

## 2016-09-30 ENCOUNTER — Encounter: Payer: Medicare Other | Admitting: *Deleted

## 2016-09-30 DIAGNOSIS — I442 Atrioventricular block, complete: Secondary | ICD-10-CM

## 2016-09-30 LAB — CUP PACEART INCLINIC DEVICE CHECK
Brady Statistic RV Percent Paced: 99.72 %
Date Time Interrogation Session: 20180430163624
Implantable Lead Implant Date: 19961115
Implantable Lead Location: 753859
Implantable Lead Location: 753860
Implantable Pulse Generator Implant Date: 20100414
Lead Channel Impedance Value: 600 Ohm
Lead Channel Pacing Threshold Amplitude: 0.75 V
Lead Channel Pacing Threshold Amplitude: 1.25 V
Lead Channel Pacing Threshold Pulse Width: 0.6 ms
Lead Channel Pacing Threshold Pulse Width: 0.8 ms
Lead Channel Sensing Intrinsic Amplitude: 10.8 mV
Lead Channel Setting Pacing Amplitude: 1 V
Lead Channel Setting Pacing Amplitude: 2.5 V
Lead Channel Setting Pacing Pulse Width: 0.6 ms
Lead Channel Setting Sensing Sensitivity: 2 mV
MDC IDC LEAD IMPLANT DT: 19961115
MDC IDC MSMT BATTERY VOLTAGE: 2.84 V
MDC IDC MSMT LEADCHNL RA IMPEDANCE VALUE: 375 Ohm
MDC IDC MSMT LEADCHNL RA PACING THRESHOLD AMPLITUDE: 1.25 V
MDC IDC MSMT LEADCHNL RA PACING THRESHOLD PULSEWIDTH: 0.8 ms
MDC IDC MSMT LEADCHNL RA SENSING INTR AMPL: 2.5 mV
MDC IDC MSMT LEADCHNL RV PACING THRESHOLD AMPLITUDE: 0.75 V
MDC IDC MSMT LEADCHNL RV PACING THRESHOLD PULSEWIDTH: 0.6 ms
MDC IDC STAT BRADY RA PERCENT PACED: 1.4 %
Pulse Gen Model: 2110
Pulse Gen Serial Number: 2270306

## 2016-09-30 NOTE — Progress Notes (Signed)
Pacemaker check in clinic. Normal device function. Thresholds, sensing, impedances consistent with previous measurements. Device programmed to maximize longevity. 3 mode switches, peak A 216, longest lasting 8sec. 1 high ventricular rate noted - no EGMs available. Device programmed at appropriate safety margins. Histogram distribution appropriate for patient activity level. Device programmed to optimize intrinsic conduction. Estimated longevity 3.5-4.39yrs. ROV with SK 12/2016 - new remote monitor paired

## 2016-09-30 NOTE — Telephone Encounter (Signed)
Confirmed remote transmission w/ pt daughter.   

## 2016-10-03 ENCOUNTER — Encounter: Payer: Self-pay | Admitting: Cardiology

## 2016-10-10 DIAGNOSIS — I5022 Chronic systolic (congestive) heart failure: Secondary | ICD-10-CM | POA: Diagnosis not present

## 2016-10-10 DIAGNOSIS — I5032 Chronic diastolic (congestive) heart failure: Secondary | ICD-10-CM | POA: Diagnosis not present

## 2016-10-10 DIAGNOSIS — R600 Localized edema: Secondary | ICD-10-CM | POA: Diagnosis not present

## 2016-10-10 DIAGNOSIS — N183 Chronic kidney disease, stage 3 (moderate): Secondary | ICD-10-CM | POA: Diagnosis not present

## 2016-10-16 DIAGNOSIS — M545 Low back pain: Secondary | ICD-10-CM | POA: Diagnosis not present

## 2016-10-24 ENCOUNTER — Ambulatory Visit: Payer: Medicare Other | Admitting: Cardiology

## 2016-10-30 ENCOUNTER — Encounter: Payer: Self-pay | Admitting: Cardiology

## 2016-10-30 ENCOUNTER — Encounter: Payer: Self-pay | Admitting: *Deleted

## 2016-10-30 ENCOUNTER — Ambulatory Visit (INDEPENDENT_AMBULATORY_CARE_PROVIDER_SITE_OTHER): Payer: Medicare Other | Admitting: Cardiology

## 2016-10-30 VITALS — BP 126/72 | HR 73 | Ht 62.0 in | Wt 153.0 lb

## 2016-10-30 DIAGNOSIS — Z87898 Personal history of other specified conditions: Secondary | ICD-10-CM | POA: Diagnosis not present

## 2016-10-30 DIAGNOSIS — I2510 Atherosclerotic heart disease of native coronary artery without angina pectoris: Secondary | ICD-10-CM

## 2016-10-30 NOTE — Patient Instructions (Addendum)
Medication Instructions:   Your physician recommends that you continue on your current medications as directed. Please refer to the Current Medication list given to you today.   If you need a refill on your cardiac medications before your next appointment, please call your pharmacy.  Labwork: NONE ORDERED  TODAY    Testing/Procedures: NONE ORDERED  TODAY    Follow-Up:  AS SCHEDULED IN July    Any Other Special Instructions Will Be Listed Below (If Applicable).

## 2016-10-30 NOTE — Progress Notes (Signed)
10/30/2016 [name] [name]   1920/10/01  [id number]  Primary Physician Lajean Manes, MD Primary Cardiologist: Dr. Meda Coffee  Electrophysiologist: Dr. Caryl Comes   Reason for Visit/CC: F/u for Syncope   HPI:  [name] [name] is a 83 y.o. female who is being seen today for f/u given recent syncope. She has a history of CAD status post CABG in 1995, atrial tachycardia, complete heart block status post permanent pacemaker which is followed by Dr. Caryl Comes. Her last cardiac catheterization was in 2007. This demonstrated 3 out of 4 patent grafts. She also has a history of aortic stenosis. Her most recent echocardiogram 09/02/16 showed normal left ventricular ejection fraction of 55-60%, mild aortic stenosis with valve area of 1.52 cm and a mean gradient of 12 mmHg.  She presents to clinic today for follow-up given her recent syncopal episode which occurred at home. The patient was standing and getting ready to get in the shower and she sudden she passed out. She was seen by Dr. Meda Coffee. Her pacemaker was interrogated and revealed normal functioning. No adverse cardiac arrhythmias. It was felt that her Imdur was likely contributing to vasodilation. Dr. Meda Coffee ordered for her to discontinue this. She was continued on carvedilol and Lasix. She notes that she was previously on Lasix 3 times a week however her PCP Dr. Felipa Eth recommended that she increase the frequency back to daily even the development of significant bilateral lower extremity edema and significant weight gain.  In follow-up, she reports that she has done well. She did discontinue Imdur. She continues to take Lasix daily. She has not had any recurrent syncope. No near syncope, dizziness or lightheadedness. She also denies chest pain or dyspnea. No lower extremity edema. Her weight has remained stable. Blood pressure today in clinic is 126/72. Heart rate 73 bpm.  Current Meds  Medication Sig  . acetaminophen (TYLENOL) 325 MG tablet Take 650 mg by  mouth every 6 (six) hours as needed.    . carvedilol (COREG) 25 MG tablet Take 25 mg by mouth 2 (two) times daily with a meal.  . Cholecalciferol (VITAMIN D3) 5000 UNITS CAPS Take 1 capsule by mouth daily.  . Cyanocobalamin (B-12 PO) Vitamin B12, 1 tablet by mouth twice weekly.  . furosemide (LASIX) 20 MG tablet TAKE 1 TABLET BY MOUTH ONCE DAILY  . meclizine (ANTIVERT) 12.5 MG tablet Take 12.5 mg by mouth 3 (three) times daily as needed for dizziness.  . Melatonin 5 MG TABS Take 3 tablets by mouth daily.   . Multiple Vitamins-Minerals (CENTRUM SILVER PO) Take 1 tablet by mouth daily.   . Multiple Vitamins-Minerals (ICAPS PO) Take by mouth. ICAPS Multiple Vitamin, 1 capsule by mouth daily.  . Omega-3 Fatty Acids (FISH OIL OMEGA-3 PO) Take 1 capsule by mouth daily.  Marland Kitchen omeprazole (PRILOSEC) 20 MG capsule Take 1 capsule (20 mg total) by mouth daily. (Patient taking differently: Take 20 mg by mouth 2 (two) times daily before a meal. )  . polyethylene glycol powder (MIRALAX) powder Take by mouth daily. 1/4 tsp by mouth daily for constipation  . venlafaxine XR (EFFEXOR-XR) 37.5 MG 24 hr capsule Take 37.5 mg by mouth daily. with food   Allergies  Allergen Reactions  . Aspirin Other (See Comments)    Md told her not to take-kidneys or intestines?  . Ibuprofen Other (See Comments)    Md told her not to take-kidneys or intestines?  . Hydrocodone Other (See Comments)    Upset stomach   Past Medical History:  Diagnosis Date  . Anxiety     on med bid since 24-Sep-2005  . CAD (coronary artery disease)   . Carotid arterial disease (HCC)    sp cae   . Complete heart block ([place])   . Decreased hearing    left ear  . Hyperkalemia   . Hyperlipidemia   . LV dysfunction    intercurrent normalization of lv function- cath 24-Sep-2005  . Macular degeneration    impaired vision  . Mitral regurgitation    moderate  . Osteoarthritis   . Pacemaker -[place]    Date implanted 09/25/94; generator replacement 2008/09/24  .  Syncope    Family History  Problem Relation Age of Onset  . Melanoma Daughter   . Other Mother        cerebral hemorrage  . Other Father        grangerene in one leg   Past Surgical History:  Procedure Laterality Date  . ABDOMINAL HYSTERECTOMY    . CAROTID ENDARTERECTOMY  1995  . CORONARY ANGIOPLASTY  24-Sep-2005  . CORONARY ARTERY BYPASS GRAFT  1995   x4  . [place] and replaced April 2010  . Rt knee surgery  05/2009   [facility]   Social History   Social History  . Marital status: Married    Spouse name: N/A  . Number of children: N/A  . Years of education: N/A   Occupational History  . Not on file.   Social History Main Topics  . Smoking status: Former Research scientist (life sciences)  . Smokeless tobacco: Never Used     Comment: pt stopped a long time ago  . Alcohol use No  . Drug use: No  . Sexual activity: No   Other Topics Concern  . 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  Cardiovascular: negative for chest pain, dyspnea on exertion, edema, orthopnea, palpitations, paroxysmal nocturnal dyspnea or shortness of breath Dermatological: negative for rash Respiratory: negative for cough or wheezing Urologic: negative for hematuria Abdominal: negative for nausea, vomiting, diarrhea, bright red blood per rectum, melena, or hematemesis Neurologic: negative for visual changes, syncope, or dizziness All other systems reviewed and are otherwise negative except as noted above.   Physical Exam:  Blood pressure 126/72, pulse 73, height 5\' 2"  (1.575 m), weight 153 lb (69.4 kg), SpO2 95 %.  General appearance: alert, cooperative and no  distress Neck: no carotid bruit and no JVD Lungs: clear to auscultation bilaterally Heart: regular rate and rhythm, S1, S2 normal, 2/6 SM (AS murmur) Extremities: extremities normal, atraumatic, no cyanosis or edema Pulses: 2+ and symmetric Skin: Skin color, texture, turgor normal. No rashes or lesions Neurologic: Grossly normal  EKG not preformed -- personally reviewed   ASSESSMENT AND PLAN:   1. Syncope: occurred at home 09/2016. Her PPM was interrogated at that time. Normal function and no adverse arrhthymias. Echo 09/2016 showed normal LVEF and mild AS. Syncope was felt related to Vasodilation from Imdur. She has had no further recurrence since discontinuation of Imdur. No dizziness or near syncope. She  remains on daily Lasix and Coreg, but tolerating well. BP is 126/72. Pt advised to notify us or PCP if any recurrent symptoms, as she may need reduction in Lasix frequency.   2. CAD: h/o CABG in 1995. LHC in 2007 showed 3/4 patent grafts. She is stable w/o CP. No dyspnea.   3. Aortic Stenosis: mild by recent echo 09/2016. AVR 1.52 cm2. Mean gradient, 12 mmHg.   4. PPM: followed by Dr. Caryl Comes. Recent interrogation 09/2016 showed normal functioning. She has EP clinic f/u in July.   PLAN  No changes made today. Continue routine f/u with Dr. Caryl Comes.   Seeley Hissong Ladoris Gene, [facility] 10/30/2016 12:03 PM

## 2016-12-16 DIAGNOSIS — I5032 Chronic diastolic (congestive) heart failure: Secondary | ICD-10-CM | POA: Diagnosis not present

## 2016-12-16 DIAGNOSIS — N183 Chronic kidney disease, stage 3 (moderate): Secondary | ICD-10-CM | POA: Diagnosis not present

## 2016-12-16 DIAGNOSIS — I5022 Chronic systolic (congestive) heart failure: Secondary | ICD-10-CM | POA: Diagnosis not present

## 2016-12-18 ENCOUNTER — Encounter: Payer: Self-pay | Admitting: Internal Medicine

## 2016-12-18 ENCOUNTER — Ambulatory Visit (INDEPENDENT_AMBULATORY_CARE_PROVIDER_SITE_OTHER): Payer: Medicare Other | Admitting: Internal Medicine

## 2016-12-18 VITALS — BP 118/60 | HR 75 | Ht 61.0 in | Wt 156.6 lb

## 2016-12-18 DIAGNOSIS — Z950 Presence of cardiac pacemaker: Secondary | ICD-10-CM | POA: Diagnosis not present

## 2016-12-18 DIAGNOSIS — I350 Nonrheumatic aortic (valve) stenosis: Secondary | ICD-10-CM | POA: Diagnosis not present

## 2016-12-18 DIAGNOSIS — I2510 Atherosclerotic heart disease of native coronary artery without angina pectoris: Secondary | ICD-10-CM

## 2016-12-18 DIAGNOSIS — I442 Atrioventricular block, complete: Secondary | ICD-10-CM

## 2016-12-18 NOTE — Patient Instructions (Signed)
Medication Instructions: - Your physician recommends that you continue on your current medications as directed. Please refer to the Current Medication list given to you today.  Labwork: - none ordered  Procedures/Testing: - none ordered  Follow-Up: - Remote monitoring is used to monitor your Pacemaker of ICD from home. This monitoring reduces the number of office visits required to check your device to one time per year. It allows [place] to keep an eye on the functioning of your device to ensure it is working properly. You are scheduled for a device check from home on 03/20/17. You may send your transmission at any time that day. If you have a wireless device, the transmission will be sent automatically. After your physician reviews your transmission, you will receive a postcard with your next transmission date.  - Your physician wants you to follow-up in: 1 year with Dr. Caryl Comes. You will receive a reminder letter in the mail two months in advance. If you don't receive a letter, please call our office to schedule the follow-up appointment.   Any Additional Special Instructions Will Be Listed Below (If Applicable).     If you need a refill on your cardiac medications before your next appointment, please call your pharmacy.

## 2016-12-18 NOTE — Progress Notes (Signed)
Electrophysiology Office Note   Date:  12/18/2016   ID:  [name] [name], DOB [birth year], MRN [id number]  PCP:  Lajean Manes, MD  Cardiologist * Primary Electrophysiologist:    Virl Axe, MD    No chief complaint on file.    History of Present Illness: [name] [name] is a 83 y.o. female   seen in followup for ischemic heart disease with a modest depression of LV function. She is status post bypass surgery in 2002 and catheterization in 2007 demonstrating three out of four grafts patent; ejection fraction at that time was normal. Echocardiogram 2012 demonstrated normal left ventricular function with very mild aortic stenosis  Echocardiogram 6/16 demonstrated normal LV function  She has a history of complete heart block and is status post pacemaker implantation . She also has a history of atrial tachycardia.    DATE TEST    6/16    Echo   EF 60-65 % AS Mild  Grad 10  4/18    Echo   EF 55-60 % AS mild grad-12          Denies sob cp edema or palpitations,   Her husband died last May 30, 2023   Married 32 yrs       Past Medical History:  Diagnosis Date  . Anxiety     on med bid since 2007  . CAD (coronary artery disease)   . Carotid arterial disease (HCC)    sp cae   . Complete heart block ([place])   . Decreased hearing    left ear  . Hyperkalemia   . Hyperlipidemia   . LV dysfunction    intercurrent normalization of lv function- cath 2007  . Macular degeneration    impaired vision  . Mitral regurgitation    moderate  . Osteoarthritis   . Pacemaker -[place]    Date implanted 1996; generator replacement 2010  . Syncope    Past Surgical History:  Procedure Laterality Date  . ABDOMINAL HYSTERECTOMY    . CAROTID ENDARTERECTOMY  1995  . CORONARY ANGIOPLASTY  2007  . CORONARY ARTERY BYPASS GRAFT  1995   x4  . [place] and replaced April 2010  . Rt knee surgery  05/2009   [facility]     Current Outpatient Prescriptions  Medication Sig  Dispense Refill  . acetaminophen (TYLENOL) 325 MG tablet Take 650 mg by mouth every 6 (six) hours as needed.      . carvedilol (COREG) 25 MG tablet Take 25 mg by mouth 2 (two) times daily with a meal.  6  . Cholecalciferol (VITAMIN D3) 5000 UNITS CAPS Take 1 capsule by mouth daily.    . Cyanocobalamin (B-12 PO) Vitamin B12, 1 tablet by mouth twice weekly.    . furosemide (LASIX) 20 MG tablet TAKE 1 TABLET BY MOUTH ONCE DAILY 90 tablet 3  . Melatonin 5 MG TABS Take 3 tablets by mouth daily.     . Multiple Vitamins-Minerals (CENTRUM SILVER PO) Take 1 tablet by mouth daily.     . Multiple Vitamins-Minerals (ICAPS PO) Take by mouth. ICAPS Multiple Vitamin, 1 capsule by mouth daily.    . Omega-3 Fatty Acids (FISH OIL OMEGA-3 PO) Take 2 capsules by mouth daily.     Marland Kitchen omeprazole (PRILOSEC) 20 MG capsule Take 1 capsule (20 mg total) by mouth daily. 30 capsule 0  . polyethylene glycol powder (MIRALAX) powder Take by mouth daily. 1/4 tsp by mouth daily for constipation    .  venlafaxine XR (EFFEXOR-XR) 37.5 MG 24 hr capsule Take 37.5 mg by mouth daily. with food  5   No current facility-administered medications for this visit.     Allergies:   Aspirin; Ibuprofen; and Hydrocodone   Social History:  The patient  reports that she has quit smoking. She has never used smokeless tobacco. She reports that she does not drink alcohol or use drugs.   Family History:  The patient's    family history includes Melanoma in her daughter; Other in her father and mother.    ROS:  Please see the history of present illness and past medical history   PHYSICAL EXAM: VS:  BP 118/60   Pulse 75   Ht 5\' 1"  (1.549 m)   Wt 156 lb 9.6 oz (71 kg)   BMI 29.59 kg/m  , BMI Body mass index is 29.59 kg/m. Well developed and nourished in no acute distress HENT normal Neck supple with JVP-flat Carotids brisk and full without bruits Clear Device pocket well healed; without hematoma or erythema.  There is no tethering    Regular rate and ZSMOLM,7/8 systolic Abd-soft with active BS without hepatomegaly No Clubbing cyanosis edema Skin-warm and dry A & Oriented  Grossly normal sensory and motor function   EKG: P-synchronous/ AV  pacing  Device interrogation is reviewed today in detail.  See PaceArt for details.   Recent Labs: 07/13/2016: BUN 21; Creatinine, Ser 0.93; Hemoglobin 11.5; Platelets 243; Potassium 4.4; Sodium 137    Lipid Panel     Component Value Date/Time   CHOL 134 12/04/2011 1142   TRIG 141.0 12/04/2011 1142   HDL 48.90 12/04/2011 1142   CHOLHDL 3 12/04/2011 1142   VLDL 28.2 12/04/2011 1142   LDLCALC 57 12/04/2011 1142   LDLDIRECT 64.2 12/04/2011 1142     Wt Readings from Last 3 Encounters:  12/18/16 156 lb 9.6 oz (71 kg)  10/30/16 153 lb (69.4 kg)  09/02/16 147 lb (66.7 kg)      Other studies Reviewed: Additional studies/ records that were reviewed today include: none     ASSESSMENT AND PLAN:   Complete heart block stable post pacing  Pacemaker-[employer] The patient's device was interrogated. The information was reviewed. No changes were made in the programming.   Aortic stenosis  Hypertension    Blood Pressure well controlled   AS remains mild Otherwise stable  Tolerating meds well    Virl Axe, MD  12/18/2016 1:13 PM     [place] [place] [address]  [place] [address] [phone] (office) [phone] (fax)

## 2016-12-19 LAB — CUP PACEART INCLINIC DEVICE CHECK
Brady Statistic RA Percent Paced: 1.4 %
Brady Statistic RV Percent Paced: 99 %
Date Time Interrogation Session: 20180719083239
Implantable Lead Implant Date: 19961115
Implantable Lead Implant Date: 19961115
Implantable Lead Location: 753859
Implantable Lead Location: 753860
Implantable Pulse Generator Implant Date: 20100414
Lead Channel Impedance Value: 380 Ohm
Lead Channel Pacing Threshold Pulse Width: 0.6 ms
Lead Channel Pacing Threshold Pulse Width: 0.8 ms
Lead Channel Sensing Intrinsic Amplitude: 1.5 mV
MDC IDC MSMT LEADCHNL RA PACING THRESHOLD AMPLITUDE: 1.75 V
MDC IDC MSMT LEADCHNL RV IMPEDANCE VALUE: 600 Ohm
MDC IDC MSMT LEADCHNL RV PACING THRESHOLD AMPLITUDE: 0.75 V
MDC IDC PG SERIAL: 2270306
Pulse Gen Model: 2110

## 2016-12-26 ENCOUNTER — Other Ambulatory Visit: Payer: Self-pay | Admitting: Internal Medicine

## 2016-12-30 DIAGNOSIS — R0902 Hypoxemia: Secondary | ICD-10-CM | POA: Diagnosis not present

## 2017-01-24 DIAGNOSIS — L304 Erythema intertrigo: Secondary | ICD-10-CM | POA: Diagnosis not present

## 2017-01-24 DIAGNOSIS — L821 Other seborrheic keratosis: Secondary | ICD-10-CM | POA: Diagnosis not present

## 2017-02-07 DIAGNOSIS — H26491 Other secondary cataract, right eye: Secondary | ICD-10-CM | POA: Diagnosis not present

## 2017-02-07 DIAGNOSIS — Z961 Presence of intraocular lens: Secondary | ICD-10-CM | POA: Diagnosis not present

## 2017-02-07 DIAGNOSIS — H353111 Nonexudative age-related macular degeneration, right eye, early dry stage: Secondary | ICD-10-CM | POA: Diagnosis not present

## 2017-02-07 DIAGNOSIS — H353122 Nonexudative age-related macular degeneration, left eye, intermediate dry stage: Secondary | ICD-10-CM | POA: Diagnosis not present

## 2017-02-07 DIAGNOSIS — H04123 Dry eye syndrome of bilateral lacrimal glands: Secondary | ICD-10-CM | POA: Diagnosis not present

## 2017-03-11 DIAGNOSIS — D485 Neoplasm of uncertain behavior of skin: Secondary | ICD-10-CM | POA: Diagnosis not present

## 2017-03-11 DIAGNOSIS — B078 Other viral warts: Secondary | ICD-10-CM | POA: Diagnosis not present

## 2017-03-11 DIAGNOSIS — L304 Erythema intertrigo: Secondary | ICD-10-CM | POA: Diagnosis not present

## 2017-03-19 DIAGNOSIS — M1711 Unilateral primary osteoarthritis, right knee: Secondary | ICD-10-CM | POA: Diagnosis not present

## 2017-03-19 DIAGNOSIS — M25562 Pain in left knee: Secondary | ICD-10-CM | POA: Diagnosis not present

## 2017-03-19 DIAGNOSIS — G8929 Other chronic pain: Secondary | ICD-10-CM | POA: Diagnosis not present

## 2017-03-19 DIAGNOSIS — M1712 Unilateral primary osteoarthritis, left knee: Secondary | ICD-10-CM | POA: Diagnosis not present

## 2017-03-20 ENCOUNTER — Ambulatory Visit (INDEPENDENT_AMBULATORY_CARE_PROVIDER_SITE_OTHER): Payer: Medicare Other | Admitting: *Deleted

## 2017-03-20 DIAGNOSIS — I442 Atrioventricular block, complete: Secondary | ICD-10-CM | POA: Diagnosis not present

## 2017-03-20 NOTE — Progress Notes (Signed)
Remote pacemaker transmission.   

## 2017-03-24 DIAGNOSIS — N183 Chronic kidney disease, stage 3 (moderate): Secondary | ICD-10-CM | POA: Diagnosis not present

## 2017-03-24 DIAGNOSIS — I509 Heart failure, unspecified: Secondary | ICD-10-CM | POA: Diagnosis not present

## 2017-03-24 DIAGNOSIS — I700 Atherosclerosis of aorta: Secondary | ICD-10-CM | POA: Diagnosis not present

## 2017-03-24 DIAGNOSIS — Z23 Encounter for immunization: Secondary | ICD-10-CM | POA: Diagnosis not present

## 2017-03-24 DIAGNOSIS — M1712 Unilateral primary osteoarthritis, left knee: Secondary | ICD-10-CM | POA: Diagnosis not present

## 2017-03-24 DIAGNOSIS — G8929 Other chronic pain: Secondary | ICD-10-CM | POA: Diagnosis not present

## 2017-03-24 DIAGNOSIS — B369 Superficial mycosis, unspecified: Secondary | ICD-10-CM | POA: Diagnosis not present

## 2017-03-24 DIAGNOSIS — G301 Alzheimer's disease with late onset: Secondary | ICD-10-CM | POA: Diagnosis not present

## 2017-03-24 DIAGNOSIS — M549 Dorsalgia, unspecified: Secondary | ICD-10-CM | POA: Diagnosis not present

## 2017-03-24 DIAGNOSIS — R55 Syncope and collapse: Secondary | ICD-10-CM | POA: Diagnosis not present

## 2017-03-24 DIAGNOSIS — F325 Major depressive disorder, single episode, in full remission: Secondary | ICD-10-CM | POA: Diagnosis not present

## 2017-03-24 DIAGNOSIS — K219 Gastro-esophageal reflux disease without esophagitis: Secondary | ICD-10-CM | POA: Diagnosis not present

## 2017-03-25 DIAGNOSIS — N183 Chronic kidney disease, stage 3 (moderate): Secondary | ICD-10-CM | POA: Diagnosis not present

## 2017-03-25 DIAGNOSIS — I509 Heart failure, unspecified: Secondary | ICD-10-CM | POA: Diagnosis not present

## 2017-03-25 DIAGNOSIS — G301 Alzheimer's disease with late onset: Secondary | ICD-10-CM | POA: Diagnosis not present

## 2017-03-25 DIAGNOSIS — F0280 Dementia in other diseases classified elsewhere without behavioral disturbance: Secondary | ICD-10-CM | POA: Diagnosis not present

## 2017-03-25 DIAGNOSIS — F325 Major depressive disorder, single episode, in full remission: Secondary | ICD-10-CM | POA: Diagnosis not present

## 2017-03-25 DIAGNOSIS — I5032 Chronic diastolic (congestive) heart failure: Secondary | ICD-10-CM | POA: Diagnosis not present

## 2017-03-25 DIAGNOSIS — I5022 Chronic systolic (congestive) heart failure: Secondary | ICD-10-CM | POA: Diagnosis not present

## 2017-03-27 ENCOUNTER — Encounter: Payer: Self-pay | Admitting: Cardiology

## 2017-04-10 DIAGNOSIS — R21 Rash and other nonspecific skin eruption: Secondary | ICD-10-CM | POA: Diagnosis not present

## 2017-04-10 DIAGNOSIS — N760 Acute vaginitis: Secondary | ICD-10-CM | POA: Diagnosis not present

## 2017-04-10 DIAGNOSIS — R32 Unspecified urinary incontinence: Secondary | ICD-10-CM | POA: Diagnosis not present

## 2017-04-11 LAB — CUP PACEART REMOTE DEVICE CHECK
Battery Remaining Percentage: 25 %
Battery Voltage: 2.8 V
Brady Statistic AP VP Percent: 1.7 %
Brady Statistic RA Percent Paced: 1.3 %
Brady Statistic RV Percent Paced: 99 %
Date Time Interrogation Session: 20181018132804
Implantable Lead Implant Date: 19961115
Implantable Lead Location: 753860
Implantable Pulse Generator Implant Date: 20100414
Lead Channel Impedance Value: 580 Ohm
Lead Channel Pacing Threshold Amplitude: 1.75 V
Lead Channel Pacing Threshold Pulse Width: 0.8 ms
Lead Channel Sensing Intrinsic Amplitude: 1.5 mV
Lead Channel Setting Pacing Pulse Width: 0.6 ms
Lead Channel Setting Sensing Sensitivity: 2 mV
MDC IDC LEAD IMPLANT DT: 19961115
MDC IDC LEAD LOCATION: 753859
MDC IDC MSMT BATTERY REMAINING LONGEVITY: 30 mo
MDC IDC MSMT LEADCHNL RA IMPEDANCE VALUE: 340 Ohm
MDC IDC MSMT LEADCHNL RV PACING THRESHOLD AMPLITUDE: 0.5 V
MDC IDC MSMT LEADCHNL RV PACING THRESHOLD PULSEWIDTH: 0.6 ms
MDC IDC MSMT LEADCHNL RV SENSING INTR AMPL: 10.8 mV
MDC IDC PG SERIAL: 2270306
MDC IDC SET LEADCHNL RA PACING AMPLITUDE: 2.5 V
MDC IDC SET LEADCHNL RV PACING AMPLITUDE: 0.75 V
MDC IDC STAT BRADY AP VS PERCENT: 1 %
MDC IDC STAT BRADY AS VP PERCENT: 98 %
MDC IDC STAT BRADY AS VS PERCENT: 1 %
Pulse Gen Model: 2110

## 2017-04-22 DIAGNOSIS — N762 Acute vulvitis: Secondary | ICD-10-CM | POA: Diagnosis not present

## 2017-04-22 DIAGNOSIS — R32 Unspecified urinary incontinence: Secondary | ICD-10-CM | POA: Diagnosis not present

## 2017-04-23 DIAGNOSIS — Z6828 Body mass index (BMI) 28.0-28.9, adult: Secondary | ICD-10-CM | POA: Diagnosis not present

## 2017-04-23 DIAGNOSIS — G301 Alzheimer's disease with late onset: Secondary | ICD-10-CM | POA: Diagnosis not present

## 2017-04-23 DIAGNOSIS — N183 Chronic kidney disease, stage 3 (moderate): Secondary | ICD-10-CM | POA: Diagnosis not present

## 2017-04-23 DIAGNOSIS — Z79899 Other long term (current) drug therapy: Secondary | ICD-10-CM | POA: Diagnosis not present

## 2017-04-23 DIAGNOSIS — I5022 Chronic systolic (congestive) heart failure: Secondary | ICD-10-CM | POA: Diagnosis not present

## 2017-04-23 DIAGNOSIS — E663 Overweight: Secondary | ICD-10-CM | POA: Diagnosis not present

## 2017-06-12 IMAGING — US US ABDOMEN LIMITED
1 series · 14 of 25 positions shown · non-contrast
Comparison: CT abdomen and pelvis 01/24/2014

CLINICAL DATA: RIGHT upper quadrant pain for about 1 week, history
coronary artery disease, hypertension, hyperlipidemia, valvular
heart disease, former smoker

EXAM:
US ABDOMEN LIMITED - RIGHT UPPER QUADRANT

[Series 1: us abdomen limited · 0.20mm/px · 14 of 43 slices shown]
[im 1/43]
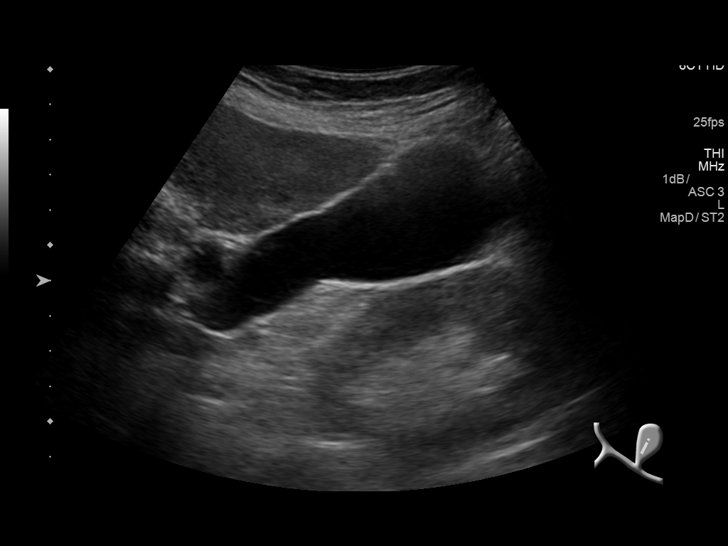
[im 4/43]
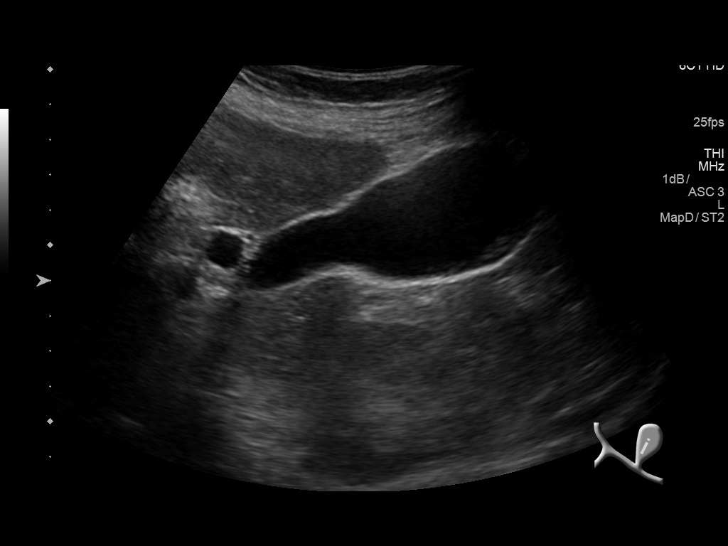
[im 8/43]
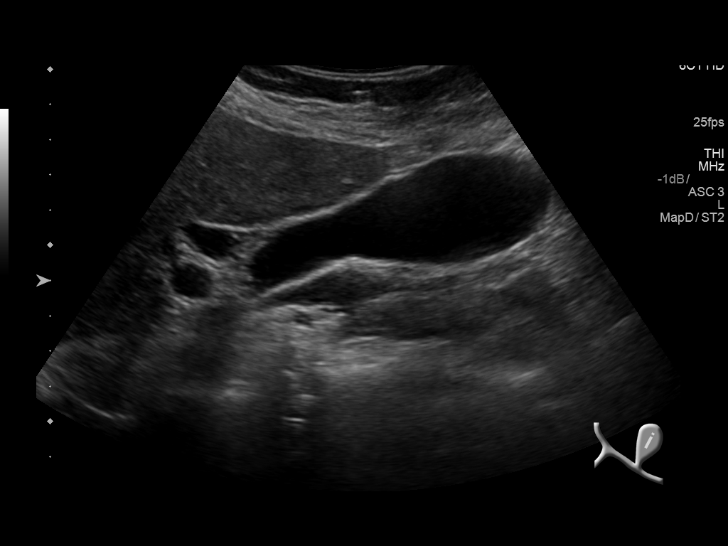
[im 11/43]
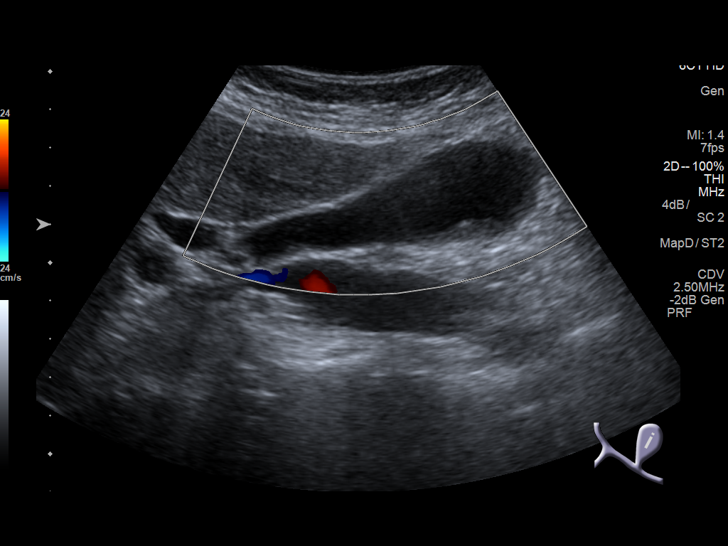
[im 15/43]
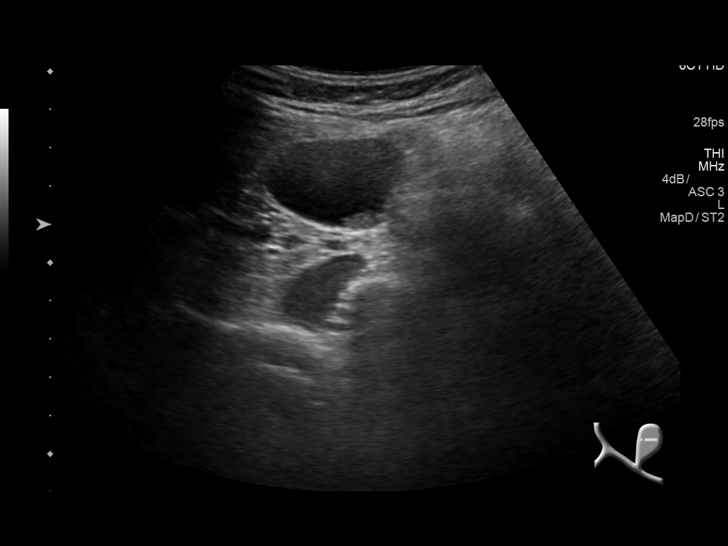
[im 16/43]
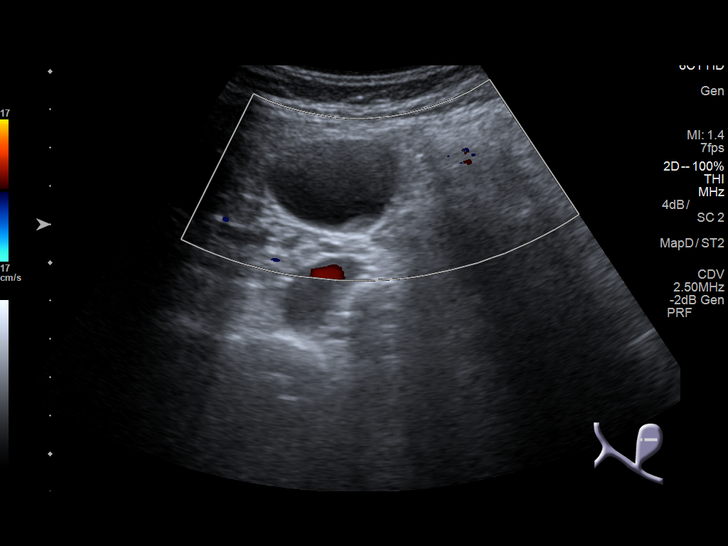
[im 20/43]
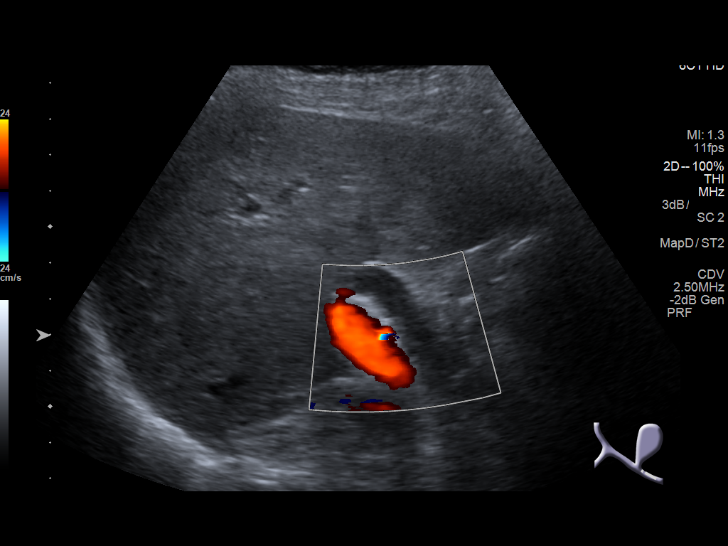
[im 23/43]
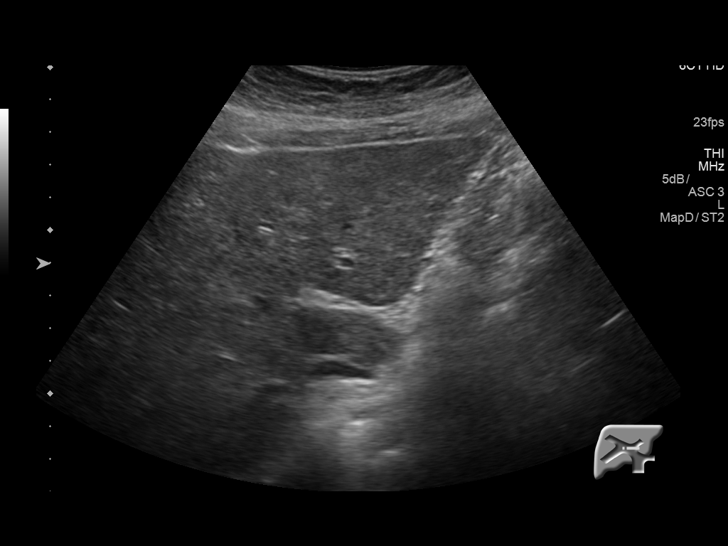
[im 27/43]
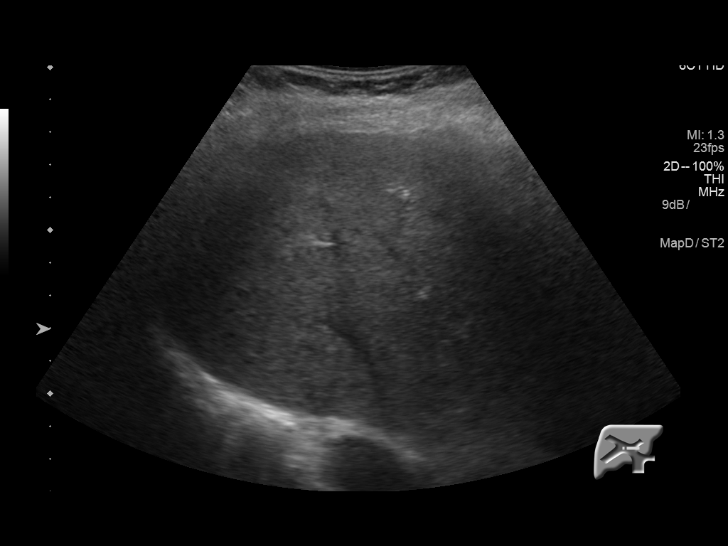
[im 29/43]
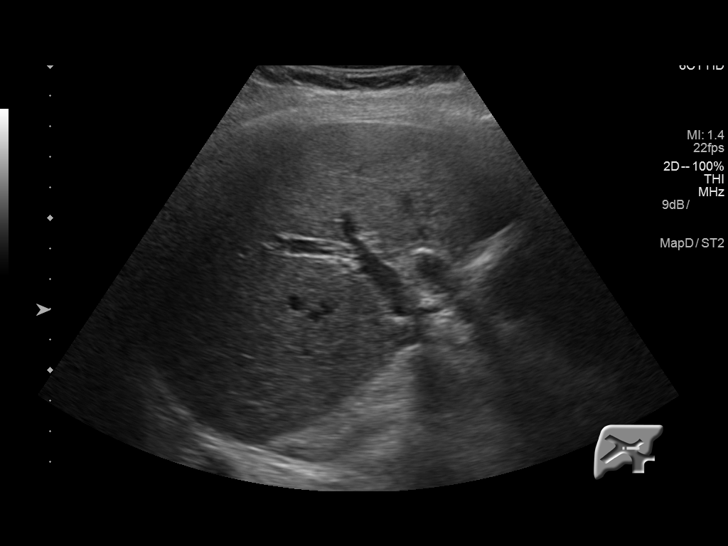
[im 32/43]
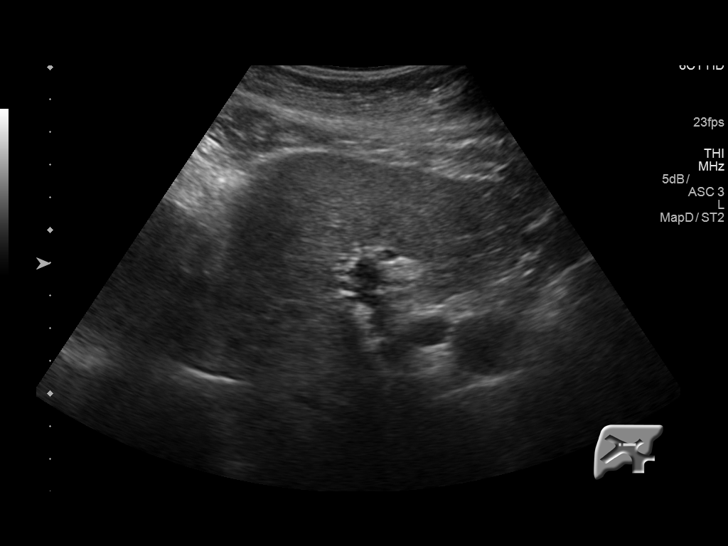
[im 36/43]
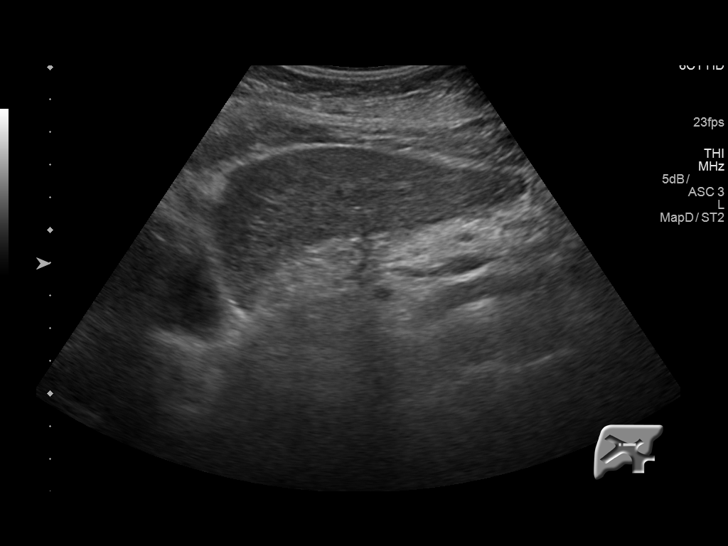
[im 39/43]
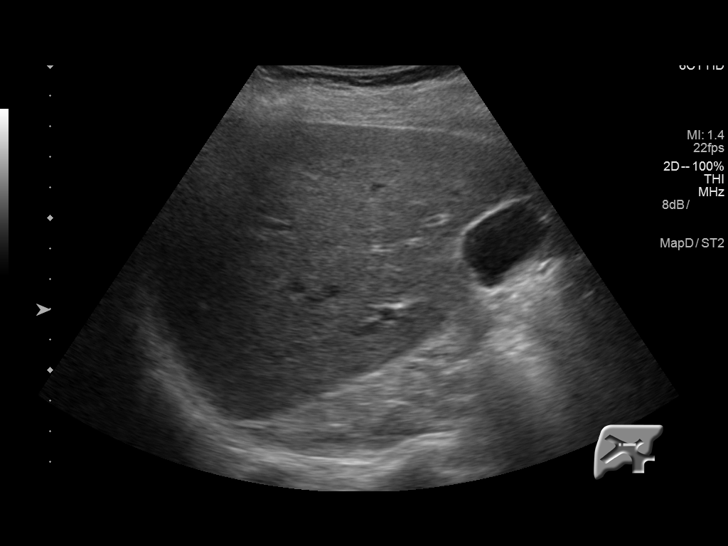
[im 43/43]
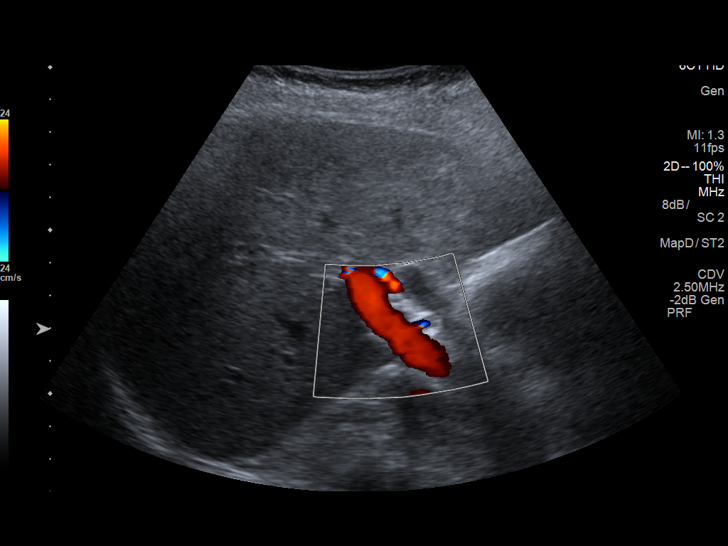

[14 of 25 positions shown; findings below may reference images not displayed]

FINDINGS: Gallbladder:

Dependent sludge in gallbladder. No shadowing calculi. No wall
thickening or pericholecystic fluid. No sonographic Murphy sign.

Common bile duct:

Diameter: Dilated, 10 mm diameter unchanged from prior CT, could
potentially be related to patient age

Liver:

Normal appearance. No intrahepatic biliary dilatation or hepatic
mass.

No RIGHT upper quadrant free fluid
IMPRESSION: Dependent sludge within gallbladder without gallstones or
gallbladder wall thickening.

Chronic CBD dilatation up to 10 mm diameter, unchanged from CT exam
of 2498, question related to patient age

## 2017-06-19 ENCOUNTER — Ambulatory Visit (INDEPENDENT_AMBULATORY_CARE_PROVIDER_SITE_OTHER): Payer: Medicare Other | Admitting: *Deleted

## 2017-06-19 DIAGNOSIS — I442 Atrioventricular block, complete: Secondary | ICD-10-CM | POA: Diagnosis not present

## 2017-06-19 NOTE — Progress Notes (Signed)
Remote pacemaker transmission.   

## 2017-06-20 ENCOUNTER — Encounter: Payer: Self-pay | Admitting: Cardiology

## 2017-06-24 LAB — CUP PACEART REMOTE DEVICE CHECK
Battery Remaining Longevity: 23 mo
Brady Statistic AP VP Percent: 1.6 %
Brady Statistic AP VS Percent: 1 %
Brady Statistic AS VP Percent: 98 %
Implantable Lead Location: 753859
Lead Channel Impedance Value: 330 Ohm
Lead Channel Impedance Value: 540 Ohm
Lead Channel Pacing Threshold Amplitude: 0.875 V
Lead Channel Pacing Threshold Pulse Width: 0.6 ms
Lead Channel Sensing Intrinsic Amplitude: 10.8 mV
Lead Channel Setting Pacing Amplitude: 2.5 V
Lead Channel Setting Pacing Pulse Width: 0.6 ms
MDC IDC LEAD IMPLANT DT: 19961115
MDC IDC LEAD IMPLANT DT: 19961115
MDC IDC LEAD LOCATION: 753860
MDC IDC MSMT BATTERY REMAINING PERCENTAGE: 19 %
MDC IDC MSMT BATTERY VOLTAGE: 2.77 V
MDC IDC MSMT LEADCHNL RA PACING THRESHOLD AMPLITUDE: 1.75 V
MDC IDC MSMT LEADCHNL RA PACING THRESHOLD PULSEWIDTH: 0.8 ms
MDC IDC MSMT LEADCHNL RA SENSING INTR AMPL: 2.1 mV
MDC IDC PG IMPLANT DT: 20100414
MDC IDC SESS DTM: 20190117155039
MDC IDC SET LEADCHNL RV PACING AMPLITUDE: 1.125
MDC IDC SET LEADCHNL RV SENSING SENSITIVITY: 2 mV
MDC IDC STAT BRADY AS VS PERCENT: 1 %
MDC IDC STAT BRADY RA PERCENT PACED: 1.2 %
MDC IDC STAT BRADY RV PERCENT PACED: 99 %
Pulse Gen Model: 2110
Pulse Gen Serial Number: 2270306

## 2017-08-08 DIAGNOSIS — H353111 Nonexudative age-related macular degeneration, right eye, early dry stage: Secondary | ICD-10-CM | POA: Diagnosis not present

## 2017-08-08 DIAGNOSIS — H353122 Nonexudative age-related macular degeneration, left eye, intermediate dry stage: Secondary | ICD-10-CM | POA: Diagnosis not present

## 2017-08-08 DIAGNOSIS — Z961 Presence of intraocular lens: Secondary | ICD-10-CM | POA: Diagnosis not present

## 2017-08-08 DIAGNOSIS — H26491 Other secondary cataract, right eye: Secondary | ICD-10-CM | POA: Diagnosis not present

## 2017-08-12 DIAGNOSIS — N183 Chronic kidney disease, stage 3 (moderate): Secondary | ICD-10-CM | POA: Diagnosis not present

## 2017-08-12 DIAGNOSIS — G301 Alzheimer's disease with late onset: Secondary | ICD-10-CM | POA: Diagnosis not present

## 2017-08-12 DIAGNOSIS — I700 Atherosclerosis of aorta: Secondary | ICD-10-CM | POA: Diagnosis not present

## 2017-08-12 DIAGNOSIS — I5022 Chronic systolic (congestive) heart failure: Secondary | ICD-10-CM | POA: Diagnosis not present

## 2017-08-12 DIAGNOSIS — I350 Nonrheumatic aortic (valve) stenosis: Secondary | ICD-10-CM | POA: Diagnosis not present

## 2017-08-22 DIAGNOSIS — N183 Chronic kidney disease, stage 3 (moderate): Secondary | ICD-10-CM | POA: Diagnosis not present

## 2017-08-22 DIAGNOSIS — Z79899 Other long term (current) drug therapy: Secondary | ICD-10-CM | POA: Diagnosis not present

## 2017-08-22 DIAGNOSIS — I5022 Chronic systolic (congestive) heart failure: Secondary | ICD-10-CM | POA: Diagnosis not present

## 2017-08-22 DIAGNOSIS — R0609 Other forms of dyspnea: Secondary | ICD-10-CM | POA: Diagnosis not present

## 2017-08-22 DIAGNOSIS — I5032 Chronic diastolic (congestive) heart failure: Secondary | ICD-10-CM | POA: Diagnosis not present

## 2017-08-27 DIAGNOSIS — I5022 Chronic systolic (congestive) heart failure: Secondary | ICD-10-CM | POA: Diagnosis not present

## 2017-08-27 DIAGNOSIS — F325 Major depressive disorder, single episode, in full remission: Secondary | ICD-10-CM | POA: Diagnosis not present

## 2017-08-27 DIAGNOSIS — N183 Chronic kidney disease, stage 3 (moderate): Secondary | ICD-10-CM | POA: Diagnosis not present

## 2017-08-27 DIAGNOSIS — Z1389 Encounter for screening for other disorder: Secondary | ICD-10-CM | POA: Diagnosis not present

## 2017-08-27 DIAGNOSIS — Z0000 Encounter for general adult medical examination without abnormal findings: Secondary | ICD-10-CM | POA: Diagnosis not present

## 2017-09-18 ENCOUNTER — Telehealth: Payer: Self-pay | Admitting: Cardiology

## 2017-09-18 ENCOUNTER — Encounter: Payer: Medicare Other | Admitting: *Deleted

## 2017-09-18 NOTE — Telephone Encounter (Signed)
Confirmed remote transmission w/ pt daughter.   

## 2017-09-19 ENCOUNTER — Emergency Department (HOSPITAL_COMMUNITY): Payer: Medicare Other

## 2017-09-19 ENCOUNTER — Other Ambulatory Visit: Payer: Self-pay

## 2017-09-19 ENCOUNTER — Encounter: Payer: Self-pay | Admitting: Cardiology

## 2017-09-19 ENCOUNTER — Encounter (HOSPITAL_COMMUNITY): Payer: Self-pay

## 2017-09-19 ENCOUNTER — Emergency Department (HOSPITAL_COMMUNITY)
Admission: EM | Admit: 2017-09-19 | Discharge: 2017-09-19 | Disposition: A | Discharge status: Home / Self Care | Payer: Medicare Other | Attending: Emergency Medicine | Admitting: Emergency Medicine

## 2017-09-19 DIAGNOSIS — Z5321 Procedure and treatment not carried out due to patient leaving prior to being seen by health care provider: Secondary | ICD-10-CM | POA: Diagnosis not present

## 2017-09-19 DIAGNOSIS — R079 Chest pain, unspecified: Secondary | ICD-10-CM | POA: Diagnosis not present

## 2017-09-19 LAB — BASIC METABOLIC PANEL
Anion gap: 11 (ref 5–15)
BUN: 21 mg/dL — ABNORMAL HIGH (ref 6–20)
CALCIUM: 9 mg/dL (ref 8.9–10.3)
CHLORIDE: 101 mmol/L (ref 101–111)
CO2: 26 mmol/L (ref 22–32)
Creatinine, Ser: 0.81 mg/dL (ref 0.44–1.00)
GFR calc non Af Amer: >60 mL/min (ref >60)
Glucose, Bld: 94 mg/dL (ref 65–99)
Potassium: 4.3 mmol/L (ref 3.5–5.1)
Sodium: 138 mmol/L (ref 135–145)

## 2017-09-19 LAB — CBC
HCT: 42 % (ref 36.0–46.0)
Hemoglobin: 13.6 g/dL (ref 12.0–15.0)
MCH: 29.8 pg (ref 26.0–34.0)
MCHC: 32.4 g/dL (ref 30.0–36.0)
MCV: 91.9 fL (ref 78.0–100.0)
Platelets: 267 10*3/uL (ref 150–400)
RBC: 4.57 MIL/uL (ref 3.87–5.11)
RDW: 12.7 % (ref 11.5–15.5)
WBC: 9.4 10*3/uL (ref 4.0–10.5)

## 2017-09-19 LAB — I-STAT TROPONIN, ED: TROPONIN I, POC: 0.01 ng/mL (ref 0.00–0.08)

## 2017-09-19 NOTE — ED Triage Notes (Signed)
Patient from home with chest pain and diarrhea that began this AM.  Stated had one bout of diarrhea and chest pain that has since subsided.  Went back to sleep after the pain began and woke up and pain was gone.  Has taken all morning medications.

## 2017-09-19 NOTE — ED Notes (Signed)
Pt's daughter came to desk stating that her mother wasn't in pain anymore and that she was going to take her home. Stated that her mom has My Chart and they would look at the results there.

## 2017-11-04 ENCOUNTER — Ambulatory Visit (INDEPENDENT_AMBULATORY_CARE_PROVIDER_SITE_OTHER): Payer: Medicare Other | Admitting: *Deleted

## 2017-11-04 DIAGNOSIS — I442 Atrioventricular block, complete: Secondary | ICD-10-CM | POA: Diagnosis not present

## 2017-11-26 ENCOUNTER — Encounter: Payer: Self-pay | Admitting: Cardiology

## 2017-11-26 NOTE — Progress Notes (Signed)
Remote pacemaker transmission.   

## 2017-12-17 ENCOUNTER — Other Ambulatory Visit: Payer: Self-pay | Admitting: Internal Medicine

## 2017-12-18 DIAGNOSIS — Z01419 Encounter for gynecological examination (general) (routine) without abnormal findings: Secondary | ICD-10-CM | POA: Diagnosis not present

## 2017-12-18 DIAGNOSIS — L304 Erythema intertrigo: Secondary | ICD-10-CM | POA: Diagnosis not present

## 2017-12-25 LAB — CUP PACEART REMOTE DEVICE CHECK
Implantable Lead Implant Date: 19961115
Implantable Lead Location: 753859
MDC IDC LEAD IMPLANT DT: 19961115
MDC IDC LEAD LOCATION: 753860
MDC IDC PG IMPLANT DT: 20100414
MDC IDC PG SERIAL: 2270306
MDC IDC SESS DTM: 20190725115329
Pulse Gen Model: 2110

## 2017-12-30 DIAGNOSIS — M545 Low back pain: Secondary | ICD-10-CM | POA: Diagnosis not present

## 2017-12-30 DIAGNOSIS — G301 Alzheimer's disease with late onset: Secondary | ICD-10-CM | POA: Diagnosis not present

## 2017-12-30 DIAGNOSIS — G8929 Other chronic pain: Secondary | ICD-10-CM | POA: Diagnosis not present

## 2017-12-30 DIAGNOSIS — N183 Chronic kidney disease, stage 3 (moderate): Secondary | ICD-10-CM | POA: Diagnosis not present

## 2017-12-30 DIAGNOSIS — I5022 Chronic systolic (congestive) heart failure: Secondary | ICD-10-CM | POA: Diagnosis not present

## 2018-01-09 ENCOUNTER — Other Ambulatory Visit: Payer: Self-pay | Admitting: Internal Medicine

## 2018-01-20 ENCOUNTER — Ambulatory Visit (INDEPENDENT_AMBULATORY_CARE_PROVIDER_SITE_OTHER): Payer: Medicare Other | Admitting: Internal Medicine

## 2018-01-20 ENCOUNTER — Encounter: Payer: Self-pay | Admitting: Internal Medicine

## 2018-01-20 VITALS — BP 126/80 | HR 77 | Ht 61.0 in | Wt 174.6 lb

## 2018-01-20 DIAGNOSIS — I350 Nonrheumatic aortic (valve) stenosis: Secondary | ICD-10-CM

## 2018-01-20 DIAGNOSIS — I442 Atrioventricular block, complete: Secondary | ICD-10-CM

## 2018-01-20 DIAGNOSIS — Z950 Presence of cardiac pacemaker: Secondary | ICD-10-CM | POA: Diagnosis not present

## 2018-01-20 NOTE — Progress Notes (Signed)
Electrophysiology Office Note   Date:  01/20/2018   ID:  [name] [name], DOB 1921-05-16, MRN [id number]  PCP:  Lajean Manes, MD  Cardiologist * Primary Electrophysiologist:    Virl Axe, MD    No chief complaint on file.    History of Present Illness: [name] [name] is a 84 y.o. female   seen in followup for ischemic heart disease with a modest depression of LV function. She is status post bypass surgery in 2002 and catheterization in 2007 demonstrating three out of four grafts patent; ejection fraction at that time was normal. Echocardiogram 2012 demonstrated normal left ventricular function with very mild aortic stenosis  Echocardiogram 6/16 demonstrated normal LV function  She has a history of complete heart block and is status post pacemaker implantation . She also has a history of atrial tachycardia.    DATE TEST    6/16    Echo   EF 60-65 % AS Mild  Grad 10  4/18    Echo   EF 55-60 % AS mild grad-12          Her husband died last 05-15-2015  Married 72 yrs  No chest pain or shortness of breath  Some edema      Past Medical History:  Diagnosis Date  . Anxiety     on med bid since 2007  . CAD (coronary artery disease)   . Carotid arterial disease (HCC)    sp cae   . Complete heart block ([place])   . Decreased hearing    left ear  . Hyperkalemia   . Hyperlipidemia   . LV dysfunction    intercurrent normalization of lv function- cath 2007  . Macular degeneration    impaired vision  . Mitral regurgitation    moderate  . Osteoarthritis   . Pacemaker -Winder    Date implanted 1996; generator replacement 2010  . Syncope    Past Surgical History:  Procedure Laterality Date  . ABDOMINAL HYSTERECTOMY    . CAROTID ENDARTERECTOMY  1995  . CORONARY ANGIOPLASTY  2007  . CORONARY ARTERY BYPASS GRAFT  1995   x4  . [place] and replaced April 2010  . Rt knee surgery  05/2009   [facility]     Current Outpatient Medications  Medication  Sig Dispense Refill  . acetaminophen (TYLENOL) 325 MG tablet Take 650 mg by mouth every 6 (six) hours as needed.      . carvedilol (COREG) 25 MG tablet Take 25 mg by mouth 2 (two) times daily with a meal.  6  . Cholecalciferol (VITAMIN D3) 5000 UNITS CAPS Take 1 capsule by mouth daily.    . Cyanocobalamin (B-12 PO) Vitamin B12, 1 tablet by mouth twice weekly.    . furosemide (LASIX) 20 MG tablet Take 1 tablet (20 mg total) by mouth daily. Please keep upcoming appt for future refills thank you. 30 tablet 0  . Lactobacillus (PROBIOTIC ACIDOPHILUS PO) Take 1 capsule by mouth 2 (two) times daily.    . Melatonin 5 MG TABS Take 3 tablets by mouth daily.     . Multiple Vitamins-Minerals (CENTRUM SILVER PO) Take 1 tablet by mouth daily.     . Multiple Vitamins-Minerals (ICAPS PO) Take by mouth. ICAPS Multiple Vitamin, 1 capsule by mouth daily.    . Omega-3 Fatty Acids (FISH OIL OMEGA-3 PO) Take 2 capsules by mouth daily.     Marland Kitchen omeprazole (PRILOSEC) 20 MG capsule Take 1 capsule (20  mg total) by mouth daily. 30 capsule 0  . Simethicone (PHAZYME) 180 MG CAPS Take 1 capsule by mouth 2 (two) times daily.    Marland Kitchen venlafaxine XR (EFFEXOR-XR) 37.5 MG 24 hr capsule Take 37.5 mg by mouth daily. with food  5   No current facility-administered medications for this visit.     Allergies:   Aspirin; Ibuprofen; and Hydrocodone   Social History:  The patient  reports that she has quit smoking. She has never used smokeless tobacco. She reports that she does not drink alcohol or use drugs.   Family History:  The patient's    family history includes Melanoma in her daughter; Other in her father and mother.    ROS:  Please see the history of present illness and past medical history   PHYSICAL EXAM: VS:  BP 126/80   Pulse 77   Ht 5\' 1"  (1.549 m)   Wt 174 lb 9.6 oz (79.2 kg)   SpO2 94%   BMI 32.99 kg/m  , BMI Body mass index is 32.99 kg/m. Well developed and nourished in no acute distress HENT normal Neck supple  with JVP-flat Clear Regular rate and rhythm, 2/6 m preserved S2 Abd-soft with active BS No Clubbing cyanosis tr edema Skin-warm and dry A & Oriented  Grossly normal sensory and motor function   EKG: P-synchronous/ AV  pacing   Device interrogation is reviewed today in detail.  See PaceArt for details.   Recent Labs: 09/19/2017: BUN 21; Creatinine, Ser 0.81; Hemoglobin 13.6; Platelets 267; Potassium 4.3; Sodium 138    Lipid Panel     Component Value Date/Time   CHOL 134 12/04/2011 1142   TRIG 141.0 12/04/2011 1142   HDL 48.90 12/04/2011 1142   CHOLHDL 3 12/04/2011 1142   VLDL 28.2 12/04/2011 1142   LDLCALC 57 12/04/2011 1142   LDLDIRECT 64.2 12/04/2011 1142     Wt Readings from Last 3 Encounters:  01/20/18 174 lb 9.6 oz (79.2 kg)  12/18/16 156 lb 9.6 oz (71 kg)  10/30/16 153 lb (69.4 kg)      Other studies Reviewed: Additional studies/ records that were reviewed today include: none     ASSESSMENT AND PLAN: Complete heart block stable post pacing  Pacemaker-St. Jude The patient's device was interrogated.  The information was reviewed. No changes were made in the programming.      Aortic stenosis  Hypertension  BP within range   .  Virl Axe, MD  01/20/2018 4:30 PM     [place] [address] [phone] (office) [phone] (fax)

## 2018-01-20 NOTE — Patient Instructions (Signed)
Medication Instructions:  Your physician recommends that you continue on your current medications as directed. Please refer to the Current Medication list given to you today.  Labwork: None ordered.  Testing/Procedures: None ordered.  Follow-Up: Your physician wants you to follow-up in: One Year with Dr Caryl Comes. You will receive a reminder letter in the mail two months in advance. If you don't receive a letter, please call our office to schedule the follow-up appointment.  Remote monitoring is used to monitor your Pacemaker of ICD from home. This monitoring reduces the number of office visits required to check your device to one time per year. It allows [place] to keep an eye on the functioning of your device to ensure it is working properly. You are scheduled for a device check from home on 02/19/2018. You may send your transmission at any time that day. If you have a wireless device, the transmission will be sent automatically. After your physician reviews your transmission, you will receive a postcard with your next transmission date.     Any Other Special Instructions Will Be Listed Below (If Applicable).     If you need a refill on your cardiac medications before your next appointment, please call your pharmacy.

## 2018-01-21 LAB — CUP PACEART INCLINIC DEVICE CHECK
Battery Voltage: 2.72 V
Brady Statistic RV Percent Paced: 99.68 %
Implantable Lead Implant Date: 19961115
Implantable Pulse Generator Implant Date: 20100414
Lead Channel Impedance Value: 387.5 Ohm
Lead Channel Impedance Value: 612.5 Ohm
Lead Channel Pacing Threshold Amplitude: 0.5 V
Lead Channel Pacing Threshold Amplitude: 0.5 V
Lead Channel Pacing Threshold Amplitude: 2.25 V
Lead Channel Pacing Threshold Amplitude: 2.25 V
Lead Channel Pacing Threshold Pulse Width: 0.6 ms
Lead Channel Sensing Intrinsic Amplitude: 12 mV
Lead Channel Sensing Intrinsic Amplitude: 3.4 mV
Lead Channel Setting Pacing Amplitude: 2.5 V
Lead Channel Setting Sensing Sensitivity: 2 mV
MDC IDC LEAD IMPLANT DT: 19961115
MDC IDC LEAD LOCATION: 753859
MDC IDC LEAD LOCATION: 753860
MDC IDC MSMT BATTERY REMAINING LONGEVITY: 18 mo
MDC IDC MSMT LEADCHNL RA PACING THRESHOLD PULSEWIDTH: 1 ms
MDC IDC MSMT LEADCHNL RA PACING THRESHOLD PULSEWIDTH: 1 ms
MDC IDC MSMT LEADCHNL RV PACING THRESHOLD PULSEWIDTH: 0.6 ms
MDC IDC PG SERIAL: 2270306
MDC IDC SESS DTM: 20190820201204
MDC IDC SET LEADCHNL RV PACING AMPLITUDE: 1 V
MDC IDC SET LEADCHNL RV PACING PULSEWIDTH: 0.6 ms
MDC IDC STAT BRADY RA PERCENT PACED: 1 %

## 2018-02-09 ENCOUNTER — Ambulatory Visit (INDEPENDENT_AMBULATORY_CARE_PROVIDER_SITE_OTHER): Payer: Medicare Other | Admitting: *Deleted

## 2018-02-09 ENCOUNTER — Telehealth: Payer: Self-pay

## 2018-02-09 ENCOUNTER — Other Ambulatory Visit: Payer: Self-pay | Admitting: Internal Medicine

## 2018-02-09 DIAGNOSIS — I442 Atrioventricular block, complete: Secondary | ICD-10-CM | POA: Diagnosis not present

## 2018-02-09 NOTE — Telephone Encounter (Signed)
Confirmed remote transmission w/ pt son in law.

## 2018-02-10 ENCOUNTER — Encounter: Payer: Self-pay | Admitting: Cardiology

## 2018-02-10 DIAGNOSIS — H02105 Unspecified ectropion of left lower eyelid: Secondary | ICD-10-CM | POA: Diagnosis not present

## 2018-02-10 DIAGNOSIS — H353122 Nonexudative age-related macular degeneration, left eye, intermediate dry stage: Secondary | ICD-10-CM | POA: Diagnosis not present

## 2018-02-10 DIAGNOSIS — H353111 Nonexudative age-related macular degeneration, right eye, early dry stage: Secondary | ICD-10-CM | POA: Diagnosis not present

## 2018-02-10 DIAGNOSIS — H02104 Unspecified ectropion of left upper eyelid: Secondary | ICD-10-CM | POA: Diagnosis not present

## 2018-02-10 NOTE — Progress Notes (Signed)
Remote pacemaker transmission.   

## 2018-02-25 ENCOUNTER — Ambulatory Visit
Admission: RE | Admit: 2018-02-25 | Discharge: 2018-02-25 | Disposition: A | Discharge status: Home / Self Care | Payer: Medicare Other | Source: Ambulatory Visit | Attending: Internal Medicine | Admitting: Internal Medicine

## 2018-02-25 ENCOUNTER — Other Ambulatory Visit: Payer: Self-pay | Admitting: Internal Medicine

## 2018-02-25 DIAGNOSIS — M5441 Lumbago with sciatica, right side: Secondary | ICD-10-CM | POA: Diagnosis not present

## 2018-02-25 DIAGNOSIS — M47816 Spondylosis without myelopathy or radiculopathy, lumbar region: Secondary | ICD-10-CM | POA: Diagnosis not present

## 2018-03-03 LAB — CUP PACEART REMOTE DEVICE CHECK
Battery Remaining Longevity: 13 mo
Brady Statistic AP VP Percent: 1 %
Brady Statistic AP VS Percent: 1 %
Brady Statistic AS VP Percent: 99 %
Brady Statistic RA Percent Paced: 1 %
Implantable Lead Implant Date: 19961115
Implantable Lead Location: 753859
Lead Channel Impedance Value: 360 Ohm
Lead Channel Impedance Value: 590 Ohm
Lead Channel Pacing Threshold Amplitude: 0.625 V
Lead Channel Pacing Threshold Pulse Width: 0.6 ms
Lead Channel Sensing Intrinsic Amplitude: 12 mV
Lead Channel Setting Pacing Amplitude: 0.875
Lead Channel Setting Pacing Amplitude: 2.5 V
Lead Channel Setting Pacing Pulse Width: 0.6 ms
MDC IDC LEAD IMPLANT DT: 19961115
MDC IDC LEAD LOCATION: 753860
MDC IDC MSMT BATTERY REMAINING PERCENTAGE: 10 %
MDC IDC MSMT BATTERY VOLTAGE: 2.71 V
MDC IDC MSMT LEADCHNL RA PACING THRESHOLD AMPLITUDE: 2.25 V
MDC IDC MSMT LEADCHNL RA PACING THRESHOLD PULSEWIDTH: 1 ms
MDC IDC MSMT LEADCHNL RA SENSING INTR AMPL: 1.6 mV
MDC IDC PG IMPLANT DT: 20100414
MDC IDC SESS DTM: 20190910133559
MDC IDC SET LEADCHNL RV SENSING SENSITIVITY: 2 mV
MDC IDC STAT BRADY AS VS PERCENT: 1 %
MDC IDC STAT BRADY RV PERCENT PACED: 99 %
Pulse Gen Serial Number: 2270306

## 2018-03-04 DIAGNOSIS — Z23 Encounter for immunization: Secondary | ICD-10-CM | POA: Diagnosis not present

## 2018-03-08 DIAGNOSIS — M25551 Pain in right hip: Secondary | ICD-10-CM | POA: Diagnosis not present

## 2018-03-09 DIAGNOSIS — M7061 Trochanteric bursitis, right hip: Secondary | ICD-10-CM | POA: Diagnosis not present

## 2018-03-12 DIAGNOSIS — L821 Other seborrheic keratosis: Secondary | ICD-10-CM | POA: Diagnosis not present

## 2018-03-12 DIAGNOSIS — D1801 Hemangioma of skin and subcutaneous tissue: Secondary | ICD-10-CM | POA: Diagnosis not present

## 2018-03-19 DIAGNOSIS — M4317 Spondylolisthesis, lumbosacral region: Secondary | ICD-10-CM | POA: Diagnosis not present

## 2018-03-19 DIAGNOSIS — M5136 Other intervertebral disc degeneration, lumbar region: Secondary | ICD-10-CM | POA: Diagnosis not present

## 2018-03-19 DIAGNOSIS — M4186 Other forms of scoliosis, lumbar region: Secondary | ICD-10-CM | POA: Diagnosis not present

## 2018-03-19 DIAGNOSIS — M160 Bilateral primary osteoarthritis of hip: Secondary | ICD-10-CM | POA: Diagnosis not present

## 2018-03-19 DIAGNOSIS — M4688 Other specified inflammatory spondylopathies, sacral and sacrococcygeal region: Secondary | ICD-10-CM | POA: Diagnosis not present

## 2018-03-19 DIAGNOSIS — M533 Sacrococcygeal disorders, not elsewhere classified: Secondary | ICD-10-CM | POA: Diagnosis not present

## 2018-03-19 DIAGNOSIS — M4316 Spondylolisthesis, lumbar region: Secondary | ICD-10-CM | POA: Diagnosis not present

## 2018-03-19 DIAGNOSIS — M4686 Other specified inflammatory spondylopathies, lumbar region: Secondary | ICD-10-CM | POA: Diagnosis not present

## 2018-03-19 DIAGNOSIS — M25551 Pain in right hip: Secondary | ICD-10-CM | POA: Diagnosis not present

## 2018-04-16 DIAGNOSIS — M47816 Spondylosis without myelopathy or radiculopathy, lumbar region: Secondary | ICD-10-CM | POA: Diagnosis not present

## 2018-04-16 DIAGNOSIS — M545 Low back pain: Secondary | ICD-10-CM | POA: Diagnosis not present

## 2018-04-17 DIAGNOSIS — F4323 Adjustment disorder with mixed anxiety and depressed mood: Secondary | ICD-10-CM | POA: Diagnosis not present

## 2018-04-17 DIAGNOSIS — I10 Essential (primary) hypertension: Secondary | ICD-10-CM | POA: Diagnosis not present

## 2018-04-17 DIAGNOSIS — I2510 Atherosclerotic heart disease of native coronary artery without angina pectoris: Secondary | ICD-10-CM | POA: Diagnosis not present

## 2018-04-17 DIAGNOSIS — R2689 Other abnormalities of gait and mobility: Secondary | ICD-10-CM | POA: Diagnosis not present

## 2018-04-17 DIAGNOSIS — R41841 Cognitive communication deficit: Secondary | ICD-10-CM | POA: Diagnosis not present

## 2018-04-17 DIAGNOSIS — M47816 Spondylosis without myelopathy or radiculopathy, lumbar region: Secondary | ICD-10-CM | POA: Diagnosis not present

## 2018-04-23 DIAGNOSIS — I10 Essential (primary) hypertension: Secondary | ICD-10-CM | POA: Diagnosis not present

## 2018-04-23 DIAGNOSIS — F4323 Adjustment disorder with mixed anxiety and depressed mood: Secondary | ICD-10-CM | POA: Diagnosis not present

## 2018-04-23 DIAGNOSIS — I2510 Atherosclerotic heart disease of native coronary artery without angina pectoris: Secondary | ICD-10-CM | POA: Diagnosis not present

## 2018-04-23 DIAGNOSIS — R41841 Cognitive communication deficit: Secondary | ICD-10-CM | POA: Diagnosis not present

## 2018-04-23 DIAGNOSIS — M47816 Spondylosis without myelopathy or radiculopathy, lumbar region: Secondary | ICD-10-CM | POA: Diagnosis not present

## 2018-04-23 DIAGNOSIS — R2689 Other abnormalities of gait and mobility: Secondary | ICD-10-CM | POA: Diagnosis not present

## 2018-04-24 DIAGNOSIS — R2689 Other abnormalities of gait and mobility: Secondary | ICD-10-CM | POA: Diagnosis not present

## 2018-04-24 DIAGNOSIS — M47816 Spondylosis without myelopathy or radiculopathy, lumbar region: Secondary | ICD-10-CM | POA: Diagnosis not present

## 2018-04-24 DIAGNOSIS — I2510 Atherosclerotic heart disease of native coronary artery without angina pectoris: Secondary | ICD-10-CM | POA: Diagnosis not present

## 2018-04-24 DIAGNOSIS — R41841 Cognitive communication deficit: Secondary | ICD-10-CM | POA: Diagnosis not present

## 2018-04-24 DIAGNOSIS — I10 Essential (primary) hypertension: Secondary | ICD-10-CM | POA: Diagnosis not present

## 2018-04-24 DIAGNOSIS — F4323 Adjustment disorder with mixed anxiety and depressed mood: Secondary | ICD-10-CM | POA: Diagnosis not present

## 2018-04-28 DIAGNOSIS — I5032 Chronic diastolic (congestive) heart failure: Secondary | ICD-10-CM | POA: Diagnosis not present

## 2018-04-28 DIAGNOSIS — I5022 Chronic systolic (congestive) heart failure: Secondary | ICD-10-CM | POA: Diagnosis not present

## 2018-04-28 DIAGNOSIS — N183 Chronic kidney disease, stage 3 (moderate): Secondary | ICD-10-CM | POA: Diagnosis not present

## 2018-04-28 DIAGNOSIS — R350 Frequency of micturition: Secondary | ICD-10-CM | POA: Diagnosis not present

## 2018-04-28 DIAGNOSIS — G301 Alzheimer's disease with late onset: Secondary | ICD-10-CM | POA: Diagnosis not present

## 2018-05-01 DIAGNOSIS — I10 Essential (primary) hypertension: Secondary | ICD-10-CM | POA: Diagnosis not present

## 2018-05-01 DIAGNOSIS — M47816 Spondylosis without myelopathy or radiculopathy, lumbar region: Secondary | ICD-10-CM | POA: Diagnosis not present

## 2018-05-01 DIAGNOSIS — I2510 Atherosclerotic heart disease of native coronary artery without angina pectoris: Secondary | ICD-10-CM | POA: Diagnosis not present

## 2018-05-01 DIAGNOSIS — F4323 Adjustment disorder with mixed anxiety and depressed mood: Secondary | ICD-10-CM | POA: Diagnosis not present

## 2018-05-01 DIAGNOSIS — R41841 Cognitive communication deficit: Secondary | ICD-10-CM | POA: Diagnosis not present

## 2018-05-01 DIAGNOSIS — R2689 Other abnormalities of gait and mobility: Secondary | ICD-10-CM | POA: Diagnosis not present

## 2018-05-07 DIAGNOSIS — F4323 Adjustment disorder with mixed anxiety and depressed mood: Secondary | ICD-10-CM | POA: Diagnosis not present

## 2018-05-07 DIAGNOSIS — M47816 Spondylosis without myelopathy or radiculopathy, lumbar region: Secondary | ICD-10-CM | POA: Diagnosis not present

## 2018-05-07 DIAGNOSIS — I10 Essential (primary) hypertension: Secondary | ICD-10-CM | POA: Diagnosis not present

## 2018-05-07 DIAGNOSIS — R41841 Cognitive communication deficit: Secondary | ICD-10-CM | POA: Diagnosis not present

## 2018-05-07 DIAGNOSIS — I2510 Atherosclerotic heart disease of native coronary artery without angina pectoris: Secondary | ICD-10-CM | POA: Diagnosis not present

## 2018-05-07 DIAGNOSIS — R2689 Other abnormalities of gait and mobility: Secondary | ICD-10-CM | POA: Diagnosis not present

## 2018-05-12 ENCOUNTER — Ambulatory Visit (INDEPENDENT_AMBULATORY_CARE_PROVIDER_SITE_OTHER): Payer: Medicare Other

## 2018-05-12 ENCOUNTER — Telehealth: Payer: Self-pay | Admitting: Cardiology

## 2018-05-12 DIAGNOSIS — H524 Presbyopia: Secondary | ICD-10-CM | POA: Diagnosis not present

## 2018-05-12 DIAGNOSIS — H52223 Regular astigmatism, bilateral: Secondary | ICD-10-CM | POA: Diagnosis not present

## 2018-05-12 DIAGNOSIS — H353122 Nonexudative age-related macular degeneration, left eye, intermediate dry stage: Secondary | ICD-10-CM | POA: Diagnosis not present

## 2018-05-12 DIAGNOSIS — I442 Atrioventricular block, complete: Secondary | ICD-10-CM

## 2018-05-12 DIAGNOSIS — H353111 Nonexudative age-related macular degeneration, right eye, early dry stage: Secondary | ICD-10-CM | POA: Diagnosis not present

## 2018-05-12 DIAGNOSIS — H26491 Other secondary cataract, right eye: Secondary | ICD-10-CM | POA: Diagnosis not present

## 2018-05-12 DIAGNOSIS — Z961 Presence of intraocular lens: Secondary | ICD-10-CM | POA: Diagnosis not present

## 2018-05-12 NOTE — Telephone Encounter (Signed)
Confirmed remote transmission w/ pt daughter.   

## 2018-05-15 NOTE — Progress Notes (Signed)
Remote pacemaker transmission.   

## 2018-06-27 LAB — CUP PACEART REMOTE DEVICE CHECK
Battery Remaining Longevity: 5 mo
Battery Voltage: 2.66 V
Brady Statistic AP VP Percent: 1 %
Brady Statistic AP VS Percent: 1 %
Brady Statistic AS VP Percent: 99 %
Date Time Interrogation Session: 20191211142831
Implantable Lead Implant Date: 19961115
Implantable Lead Location: 753859
Implantable Lead Location: 753860
Lead Channel Impedance Value: 390 Ohm
Lead Channel Pacing Threshold Amplitude: 0.5 V
Lead Channel Pacing Threshold Amplitude: 2.25 V
Lead Channel Pacing Threshold Pulse Width: 1 ms
Lead Channel Sensing Intrinsic Amplitude: 12 mV
Lead Channel Setting Pacing Amplitude: 2.5 V
Lead Channel Setting Pacing Pulse Width: 0.6 ms
Lead Channel Setting Sensing Sensitivity: 2 mV
MDC IDC LEAD IMPLANT DT: 19961115
MDC IDC MSMT BATTERY REMAINING PERCENTAGE: 5 %
MDC IDC MSMT LEADCHNL RA SENSING INTR AMPL: 2.3 mV
MDC IDC MSMT LEADCHNL RV IMPEDANCE VALUE: 550 Ohm
MDC IDC MSMT LEADCHNL RV PACING THRESHOLD PULSEWIDTH: 0.6 ms
MDC IDC PG IMPLANT DT: 20100414
MDC IDC SET LEADCHNL RV PACING AMPLITUDE: 0.75 V
MDC IDC STAT BRADY AS VS PERCENT: 1 %
MDC IDC STAT BRADY RA PERCENT PACED: 1 %
MDC IDC STAT BRADY RV PERCENT PACED: 99 %
Pulse Gen Model: 2110
Pulse Gen Serial Number: 2270306

## 2018-07-24 DIAGNOSIS — L820 Inflamed seborrheic keratosis: Secondary | ICD-10-CM | POA: Diagnosis not present

## 2018-07-24 DIAGNOSIS — L821 Other seborrheic keratosis: Secondary | ICD-10-CM | POA: Diagnosis not present

## 2018-08-11 ENCOUNTER — Ambulatory Visit (INDEPENDENT_AMBULATORY_CARE_PROVIDER_SITE_OTHER): Payer: Medicare Other | Admitting: *Deleted

## 2018-08-11 DIAGNOSIS — I442 Atrioventricular block, complete: Secondary | ICD-10-CM

## 2018-08-13 ENCOUNTER — Telehealth: Payer: Self-pay | Admitting: *Deleted

## 2018-08-13 LAB — CUP PACEART REMOTE DEVICE CHECK
Battery Remaining Longevity: 1 mo
Battery Remaining Percentage: 0.5 %
Battery Voltage: 2.6 V
Brady Statistic RA Percent Paced: 1 %
Implantable Lead Implant Date: 19961115
Implantable Lead Implant Date: 19961115
Implantable Lead Location: 753859
Implantable Lead Location: 753860
Implantable Pulse Generator Implant Date: 20100414
Lead Channel Impedance Value: 360 Ohm
Lead Channel Pacing Threshold Amplitude: 2.25 V
Lead Channel Pacing Threshold Pulse Width: 1 ms
Lead Channel Sensing Intrinsic Amplitude: 1.7 mV
MDC IDC MSMT LEADCHNL RV IMPEDANCE VALUE: 580 Ohm
MDC IDC MSMT LEADCHNL RV PACING THRESHOLD AMPLITUDE: 0.625 V
MDC IDC MSMT LEADCHNL RV PACING THRESHOLD PULSEWIDTH: 0.6 ms
MDC IDC MSMT LEADCHNL RV SENSING INTR AMPL: 12 mV
MDC IDC SESS DTM: 20200310212644
MDC IDC SET LEADCHNL RA PACING AMPLITUDE: 2.5 V
MDC IDC SET LEADCHNL RV PACING AMPLITUDE: 0.875
MDC IDC SET LEADCHNL RV PACING PULSEWIDTH: 0.6 ms
MDC IDC SET LEADCHNL RV SENSING SENSITIVITY: 2 mV
MDC IDC STAT BRADY AP VP PERCENT: 1 %
MDC IDC STAT BRADY AP VS PERCENT: 1 %
MDC IDC STAT BRADY AS VP PERCENT: 99 %
MDC IDC STAT BRADY AS VS PERCENT: 1 %
MDC IDC STAT BRADY RV PERCENT PACED: 99 %
Pulse Gen Model: 2110
Pulse Gen Serial Number: 2270306

## 2018-08-13 NOTE — Telephone Encounter (Signed)
Pacemaker battery nearing ERI per recent scheduled remote. Plan for monthly battery checks.

## 2018-08-13 NOTE — Telephone Encounter (Signed)
Pt daughter did call back. I told per Emily's note that the pt battery is reaching ERI. I was going to let her talk with the scheduler to set up an appointment with Dr. Caryl Comes. She stated she will call the scheduler as soon as she get home.

## 2018-08-13 NOTE — Telephone Encounter (Signed)
Spoke with patient's son in law (not on [place]) as patient is not home. Requested to have patient or Hoyle Sauer (on [facility]) call back. Gave direct DC number for return call.

## 2018-08-13 NOTE — Telephone Encounter (Signed)
Spoke with Hoyle Sauer. Explained that patient's device is not at [facility] yet, estimated in 0-3 months. Explained that patient will still have 3 months of device function at current programming after ERI is reached. Hoyle Sauer is in agreement with monthly battery checks, next transmission planned for 09/11/18. She also requests to schedule f/u with Dr. Caryl Comes. Hoyle Sauer is agreeable to an appointment with Dr. Caryl Comes on Monday, 10/12/18 at 1:30pm. She thanked me for my call and denies additional questions or concerns at this time.

## 2018-08-18 ENCOUNTER — Encounter: Payer: Self-pay | Admitting: Cardiology

## 2018-08-18 NOTE — Progress Notes (Signed)
Remote pacemaker transmission.   

## 2018-09-01 DIAGNOSIS — I700 Atherosclerosis of aorta: Secondary | ICD-10-CM | POA: Diagnosis not present

## 2018-09-01 DIAGNOSIS — I5032 Chronic diastolic (congestive) heart failure: Secondary | ICD-10-CM | POA: Diagnosis not present

## 2018-09-01 DIAGNOSIS — I350 Nonrheumatic aortic (valve) stenosis: Secondary | ICD-10-CM | POA: Diagnosis not present

## 2018-09-01 DIAGNOSIS — G301 Alzheimer's disease with late onset: Secondary | ICD-10-CM | POA: Diagnosis not present

## 2018-09-01 DIAGNOSIS — N183 Chronic kidney disease, stage 3 (moderate): Secondary | ICD-10-CM | POA: Diagnosis not present

## 2018-09-01 DIAGNOSIS — F325 Major depressive disorder, single episode, in full remission: Secondary | ICD-10-CM | POA: Diagnosis not present

## 2018-09-01 DIAGNOSIS — Z0000 Encounter for general adult medical examination without abnormal findings: Secondary | ICD-10-CM | POA: Diagnosis not present

## 2018-09-01 DIAGNOSIS — Z79899 Other long term (current) drug therapy: Secondary | ICD-10-CM | POA: Diagnosis not present

## 2018-09-11 ENCOUNTER — Ambulatory Visit (INDEPENDENT_AMBULATORY_CARE_PROVIDER_SITE_OTHER): Payer: Medicare Other | Admitting: *Deleted

## 2018-09-11 ENCOUNTER — Other Ambulatory Visit: Payer: Self-pay

## 2018-09-11 DIAGNOSIS — I442 Atrioventricular block, complete: Secondary | ICD-10-CM

## 2018-09-11 LAB — CUP PACEART REMOTE DEVICE CHECK
Battery Remaining Longevity: 1 mo
Battery Remaining Percentage: 0.5 %
Battery Voltage: 2.6 V
Brady Statistic AP VP Percent: 1 %
Brady Statistic AP VS Percent: 1 %
Brady Statistic AS VP Percent: 99 %
Brady Statistic AS VS Percent: 1 %
Brady Statistic RA Percent Paced: 1 %
Brady Statistic RV Percent Paced: 99 %
Date Time Interrogation Session: 20200410181151
Implantable Lead Implant Date: 19961115
Implantable Lead Implant Date: 19961115
Implantable Lead Location: 753859
Implantable Lead Location: 753860
Implantable Pulse Generator Implant Date: 20100414
Lead Channel Impedance Value: 380 Ohm
Lead Channel Impedance Value: 600 Ohm
Lead Channel Pacing Threshold Amplitude: 0.625 V
Lead Channel Pacing Threshold Amplitude: 2.25 V
Lead Channel Pacing Threshold Pulse Width: 0.6 ms
Lead Channel Pacing Threshold Pulse Width: 1 ms
Lead Channel Sensing Intrinsic Amplitude: 12 mV
Lead Channel Sensing Intrinsic Amplitude: 2.9 mV
Lead Channel Setting Pacing Amplitude: 0.875
Lead Channel Setting Pacing Amplitude: 2.5 V
Lead Channel Setting Pacing Pulse Width: 0.6 ms
Lead Channel Setting Sensing Sensitivity: 2 mV
Pulse Gen Model: 2110
Pulse Gen Serial Number: 2270306

## 2018-09-18 NOTE — Progress Notes (Signed)
Remote pacemaker transmission.   

## 2018-09-18 NOTE — Addendum Note (Signed)
Addended by: Douglass Rivers D on: 09/18/2018 03:57 PM   Modules accepted: Level of Service

## 2018-10-08 ENCOUNTER — Telehealth: Payer: Self-pay

## 2018-10-08 NOTE — Telephone Encounter (Signed)
I called and left patient a message about switching visit Monday to a virtual visit.

## 2018-10-09 ENCOUNTER — Ambulatory Visit (INDEPENDENT_AMBULATORY_CARE_PROVIDER_SITE_OTHER): Payer: Medicare Other | Admitting: *Deleted

## 2018-10-09 ENCOUNTER — Other Ambulatory Visit: Payer: Self-pay

## 2018-10-09 DIAGNOSIS — I442 Atrioventricular block, complete: Secondary | ICD-10-CM

## 2018-10-09 DIAGNOSIS — Z950 Presence of cardiac pacemaker: Secondary | ICD-10-CM

## 2018-10-09 NOTE — Telephone Encounter (Signed)
Spoke with pt's daughter and she has agreed to a virtual visit. She will have BP and HR available at the time of her appointment.     YOUR CARDIOLOGY TEAM HAS ARRANGED FOR AN E-VISIT FOR YOUR APPOINTMENT - PLEASE REVIEW IMPORTANT INFORMATION BELOW SEVERAL DAYS PRIOR TO YOUR APPOINTMENT  Due to the recent COVID-19 pandemic, we are transitioning in-person office visits to tele-medicine visits in an effort to decrease unnecessary exposure to our patients, their families, and staff. These visits are billed to your insurance just like a normal visit is. We also encourage you to sign up for MyChart if you have not already done so. You will need a smartphone if possible. For patients that do not have this, we can still complete the visit using a regular telephone but do prefer a smartphone to enable video when possible. You may have a family member that lives with you that can help. If possible, we also ask that you have a blood pressure cuff and scale at home to measure your blood pressure, heart rate and weight prior to your scheduled appointment. Patients with clinical needs that need an in-person evaluation and testing will still be able to come to the office if absolutely necessary. If you have any questions, feel free to call our office.     CONSENT FOR TELE-HEALTH VISIT - PLEASE REVIEW  I hereby voluntarily request, consent and authorize [facility] and its employed or contracted physicians, physician assistants, nurse practitioners or other licensed health care professionals (the Practitioner), to provide me with telemedicine health care services (the "Services") as deemed necessary by the treating Practitioner. I acknowledge and consent to receive the Services by the Practitioner via telemedicine. I understand that the telemedicine visit will involve communicating with the Practitioner through live audiovisual communication technology and the disclosure of certain medical information by electronic  transmission. I acknowledge that I have been given the opportunity to request an in-person assessment or other available alternative prior to the telemedicine visit and am voluntarily participating in the telemedicine visit.  I understand that I have the right to withhold or withdraw my consent to the use of telemedicine in the course of my care at any time, without affecting my right to future care or treatment, and that the Practitioner or I may terminate the telemedicine visit at any time. I understand that I have the right to inspect all information obtained and/or recorded in the course of the telemedicine visit and may receive copies of available information for a reasonable fee.  I understand that some of the potential risks of receiving the Services via telemedicine include:  Marland Kitchen Delay or interruption in medical evaluation due to technological equipment failure or disruption; . Information transmitted may not be sufficient (e.g. poor resolution of images) to allow for appropriate medical decision making by the Practitioner; and/or  . In rare instances, security protocols could fail, causing a breach of personal health information.  Furthermore, I acknowledge that it is my responsibility to provide information about my medical history, conditions and care that is complete and accurate to the best of my ability. I acknowledge that Practitioner's advice, recommendations, and/or decision may be based on factors not within their control, such as incomplete or inaccurate data provided by me or distortions of diagnostic images or specimens that may result from electronic transmissions. I understand that the practice of medicine is not an exact science and that Practitioner makes no warranties or guarantees regarding treatment outcomes. I acknowledge that I will  receive a copy of this consent concurrently upon execution via email to the email address I last provided but may also request a printed copy by  calling the office of [facility].    I understand that my insurance will be billed for this visit.   I have read or had this consent read to me. . I understand the contents of this consent, which adequately explains the benefits and risks of the Services being provided via telemedicine.  . I have been provided ample opportunity to ask questions regarding this consent and the Services and have had my questions answered to my satisfaction. . I give my informed consent for the services to be provided through the use of telemedicine in my medical care  By participating in this telemedicine visit I agree to the above.

## 2018-10-12 ENCOUNTER — Encounter: Payer: Self-pay | Admitting: Internal Medicine

## 2018-10-12 ENCOUNTER — Telehealth (INDEPENDENT_AMBULATORY_CARE_PROVIDER_SITE_OTHER): Payer: Medicare Other | Admitting: Internal Medicine

## 2018-10-12 VITALS — BP 132/76 | HR 72 | Ht 61.0 in | Wt 173.7 lb

## 2018-10-12 DIAGNOSIS — Z87898 Personal history of other specified conditions: Secondary | ICD-10-CM

## 2018-10-12 DIAGNOSIS — I442 Atrioventricular block, complete: Secondary | ICD-10-CM

## 2018-10-12 DIAGNOSIS — I5033 Acute on chronic diastolic (congestive) heart failure: Secondary | ICD-10-CM

## 2018-10-12 DIAGNOSIS — Z950 Presence of cardiac pacemaker: Secondary | ICD-10-CM

## 2018-10-12 LAB — CUP PACEART REMOTE DEVICE CHECK
Date Time Interrogation Session: 20200511223348
Implantable Lead Implant Date: 19961115
Implantable Lead Implant Date: 19961115
Implantable Lead Location: 753859
Implantable Lead Location: 753860
Implantable Pulse Generator Implant Date: 20100414
Pulse Gen Model: 2110
Pulse Gen Serial Number: 2270306

## 2018-10-12 MED ORDER — FUROSEMIDE 20 MG PO TABS: 40.0000 mg | ORAL_TABLET | ORAL | 11 refills | Status: DC

## 2018-10-12 NOTE — Progress Notes (Signed)
Electrophysiology TeleHealth Note   Due to national recommendations of social distancing due to COVID 19, an audio/video telehealth visit is felt to be most appropriate for this patient at this time.  See MyChart message from today for the patient's consent to telehealth for [facility].   Date:  10/12/2018   ID:  [name] [name], DOB 16-Apr-1921, MRN [id number]  Location: patient's home  Provider location: [address] [place]  Evaluation Performed: Follow-up visit  PCP:  Lajean Manes, MD  Cardiologist:     Electrophysiologist:  SK   Chief Complaint:  Dyspnea on exertion   History of Present Illness:    [name] [name] is a 85 y.o. female who presents via audio/video conferencing for a telehealth visit today.  Since last being seen in our clinic for complete heart block, pacer approaching ERI and Mild AS  the patient reports increasing dyspnea with exertion.  Unassociated with peripheral edema or nocturnal dyspnea.  Weight however is up considerably about 30 pounds in 2 years.  Her device is reached ERI.  Saint Jude.  The patient denies symptoms of fevers, chills, cough, or new SOB worrisome for COVID 19.    Past Medical History:  Diagnosis Date  . Anxiety     on med bid since 2007  . CAD (coronary artery disease)   . Carotid arterial disease (HCC)    sp cae   . Complete heart block ([place])   . Decreased hearing    left ear  . Hyperkalemia   . Hyperlipidemia   . LV dysfunction    intercurrent normalization of lv function- cath 2007  . Macular degeneration    impaired vision  . Mitral regurgitation    moderate  . Osteoarthritis   . Pacemaker -[place]    Date implanted 1996; generator replacement 2010  . Syncope     Past Surgical History:  Procedure Laterality Date  . ABDOMINAL HYSTERECTOMY    . CAROTID ENDARTERECTOMY  1995  . CORONARY ANGIOPLASTY  2007  . CORONARY ARTERY BYPASS GRAFT  1995   x4  . [place] and  replaced April 2010  . Rt knee surgery  05/2009   [facility]    Current Outpatient Medications  Medication Sig Dispense Refill  . acetaminophen (TYLENOL) 325 MG tablet Take 650 mg by mouth every 6 (six) hours as needed.      . carvedilol (COREG) 25 MG tablet Take 25 mg by mouth 2 (two) times daily with a meal.  6  . Cholecalciferol (VITAMIN D3) 5000 UNITS CAPS Take 1 capsule by mouth daily.    . Cyanocobalamin (B-12 PO) Vitamin B12, 1 tablet by mouth twice weekly.    Marland Kitchen docusate sodium (COLACE) 100 MG capsule Take 100 mg by mouth daily.    . furosemide (LASIX) 20 MG tablet TAKE 1 TABLET (20 MG TOTAL) BY MOUTH DAILY. PLEASE KEEP UPCOMING APPT FOR FUTURE REFILLS THANK YOU. 30 tablet 11  . Lactobacillus (PROBIOTIC ACIDOPHILUS PO) Take 1 capsule by mouth 2 (two) times daily.    . Melatonin 5 MG TABS Take 3 tablets by mouth daily.     . Multiple Vitamins-Minerals (CENTRUM SILVER PO) Take 1 tablet by mouth daily.     . Multiple Vitamins-Minerals (ICAPS PO) Take by mouth. ICAPS Multiple Vitamin, 1 capsule by mouth daily.    Marland Kitchen MYRBETRIQ 50 MG TB24 tablet Take 50 mg by mouth daily.    Marland Kitchen omeprazole (PRILOSEC) 20 MG capsule  Take 1 capsule (20 mg total) by mouth daily. 30 capsule 0  . Polyethyl Glycol-Propyl Glycol (SYSTANE) 0.4-0.3 % SOLN Place 1 drop into both eyes daily.    . Simethicone (PHAZYME) 180 MG CAPS Take 1 capsule by mouth 2 (two) times daily.    . sodium chloride (MURO 128) 5 % ophthalmic ointment Place 1 application into both eyes at bedtime.    Marland Kitchen venlafaxine XR (EFFEXOR-XR) 37.5 MG 24 hr capsule Take 37.5 mg by mouth daily. with food  5   No current facility-administered medications for this visit.     Allergies:   Aspirin; Ibuprofen; and Hydrocodone   Social History:  The patient  reports that she has quit smoking. She has never used smokeless tobacco. She reports that she does not drink alcohol or use drugs.   Family History:  The patient's   family history includes Melanoma in her  daughter; Other in her father and mother.   ROS:  Please see the history of present illness.   All other systems are personally reviewed and negative.    Exam:    Vital Signs:  BP 132/76   Pulse 72   Ht 5\' 1"  (1.549 m)   Wt 173 lb 11.2 oz (78.8 kg)   SpO2 93%   BMI 32.82 kg/m     Well appearing, alert and conversant, regular work of breathing,  good skin color Eyes- anicteric, neuro- grossly intact, skin- no apparent rash or lesions or cyanosis, mouth- oral mucosa is pink Responds to questions as put to her by her daughter   Labs/Other Tests and Data Reviewed:    Recent Labs: No results found for requested labs within last 8760 hours.   Wt Readings from Last 3 Encounters:  10/12/18 173 lb 11.2 oz (78.8 kg)  01/20/18 174 lb 9.6 oz (79.2 kg)  12/18/16 156 lb 9.6 oz (71 kg)     Other studies personally reviewed: Additional studies/ records that were reviewed today include: As above  Review of the above records today demonstrates:As above  Prior radiographs:    Pacemaker interrogated 09/21/18 device at [facility]:    Complete heart block    Pacemaker-St. Jude  device @ ERI  4/20   Aortic stenosis (mean grad 12- 4/18)  Hypertension  CHF acute/chronic diastolic  Her device reached ERI 09/21/2018.  She will need to undergo device generator replacement in the next couple of months.  She is having significant dyspnea on exertion.  This occurs in the context of a 30 pound weight gain over the last 2 years; I suspect that a large amount of this is fluid not withstanding the paucity of edema reported by her family and not withstanding the absence of nocturnal dyspnea orthopnea.  We will undertake diuresis increasing her furosemide from 20 every day to 40 every other day for about 10 days; hopefully this will help clarify fluid status.  Her DOE may also be related to AS; if the weight gain/CHF is all secondary, which I doubt, the dyspnea should improve with  diuresis -- if not will need to repeat echo    AS guidelines monitoring every 3-5 yrs if still ASx   COVID 19 screen The patient denies symptoms of COVID 19 at this time.  The importance of social distancing was discussed today.  Follow-up:   Phone call next week to assess dyspnea and weight    Current medicines are reviewed at length with the patient today.   The  patient does not have concerns regarding her medicines.  The following changes were made today:   Increase furosemide 20 qd >>40 mg qod     Labs/ tests ordered today include:   No orders of the defined types were placed in this encounter.   Future tests ( post COVID )  Pacemaker generator replacement in 1-2  Months--( I had told the family, having misread ERI, that we would need to do it < 19mon)   Patient Risk:  after full review of this patients clinical status, I feel that they are at moderate  risk at this time.  Today, I have spent 12* minutes with the patient with telehealth technology discussing the above.  Signed, Virl Axe, MD  10/12/2018 1:41 PM     [place] [address] [place] [address] [phone] (office) [phone] (fax)

## 2018-10-12 NOTE — Addendum Note (Signed)
Addended by: Dollene Primrose on: 10/12/2018 05:28 PM   Modules accepted: Orders

## 2018-10-13 ENCOUNTER — Encounter: Payer: Self-pay | Admitting: Cardiology

## 2018-10-13 NOTE — Addendum Note (Signed)
Addended by: Tiajuana Amass on: 10/13/2018 03:00 PM   Modules accepted: Level of Service

## 2018-10-13 NOTE — Progress Notes (Signed)
Remote pacemaker transmission.   

## 2018-10-16 NOTE — Telephone Encounter (Signed)
I called and spoke with the patient's daughter regarding the replacing of the pacemaker.  Patient is in complete heart block.  The daughter was confused as to the presence of a defibrillator.  Not withstanding, she complains have discussed not replacing the pacemaker.  I think it is a right and good decision to choose to replace or to choose not to replace the device.  I also discussed with her hospice support and have reached out to her mother's primary care physician, Dr. Felipa Eth, who is agreeable to reach back out to the family to discuss DNR and hospice.  We will not see the patient in follow-up.

## 2018-12-17 DIAGNOSIS — N390 Urinary tract infection, site not specified: Secondary | ICD-10-CM | POA: Diagnosis not present

## 2019-01-20 ENCOUNTER — Other Ambulatory Visit: Payer: Self-pay | Admitting: Internal Medicine

## 2019-01-20 NOTE — Telephone Encounter (Signed)
°*  STAT* If patient is at the pharmacy, call can be transferred to refill team.   1. Which medications need to be refilled? (please list name of each medication and dose if known) Furosemide 20 mg  2. Which pharmacy/location (including street and city if local pharmacy) is medication to be sent to? [facility]  3. Do they need a 30 day or 90 day supply? 90 patient is out of medication appt in 10/20

## 2019-01-22 MED ORDER — FUROSEMIDE 20 MG PO TABS: 40.0000 mg | ORAL_TABLET | ORAL | 9 refills | Status: AC

## 2019-01-22 NOTE — Telephone Encounter (Signed)
Pt's medication was sent to pt's pharmacy as requested. Confirmation received.  °

## 2019-03-17 DIAGNOSIS — Z23 Encounter for immunization: Secondary | ICD-10-CM | POA: Diagnosis not present

## 2019-03-28 IMAGING — CR DG CHEST 2V
2 series · 2 of 2 positions shown · non-contrast
Comparison: 07/26/2015

CLINICAL DATA: Mid chest pain

EXAM:
CHEST - 2 VIEW

[chest lat]
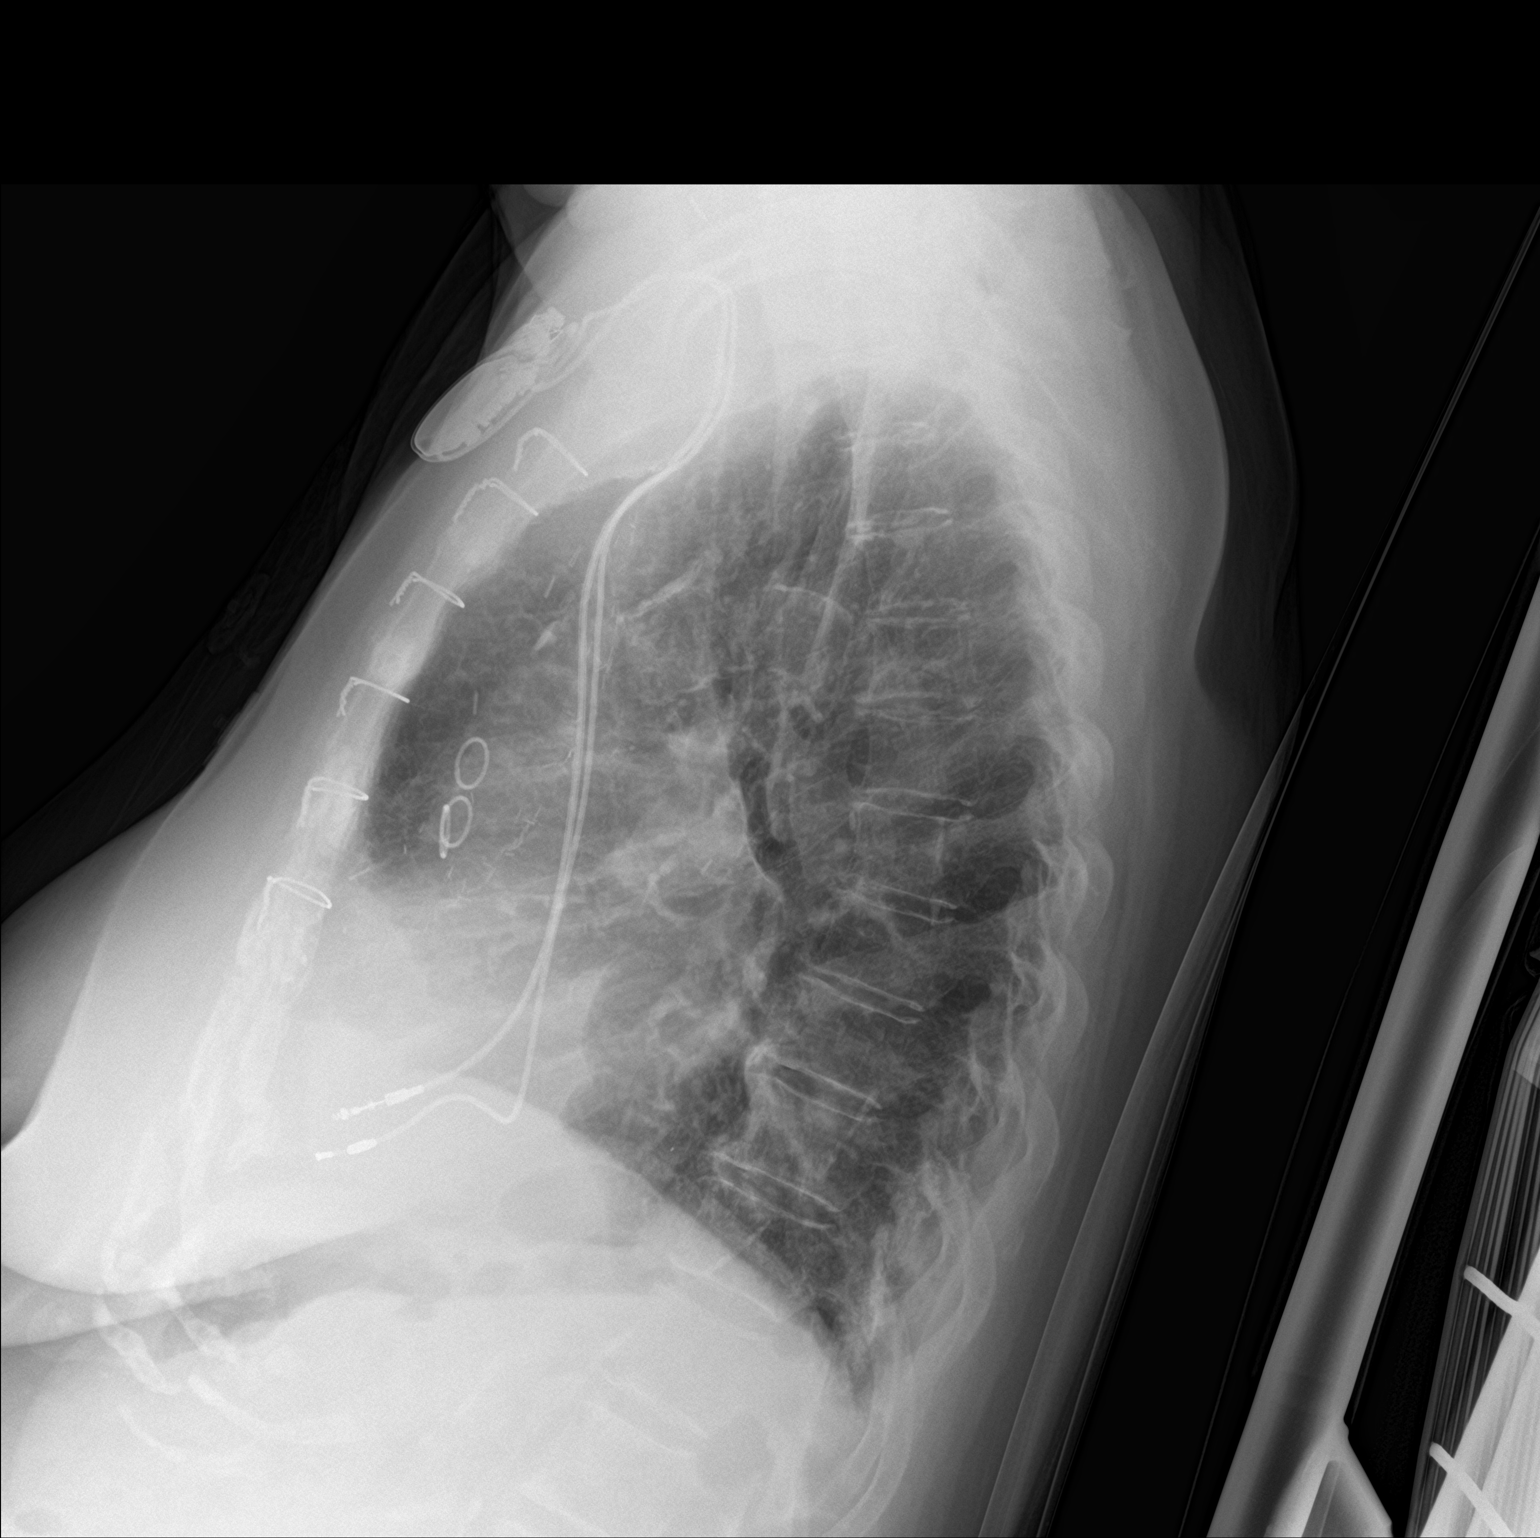

[chest ap]
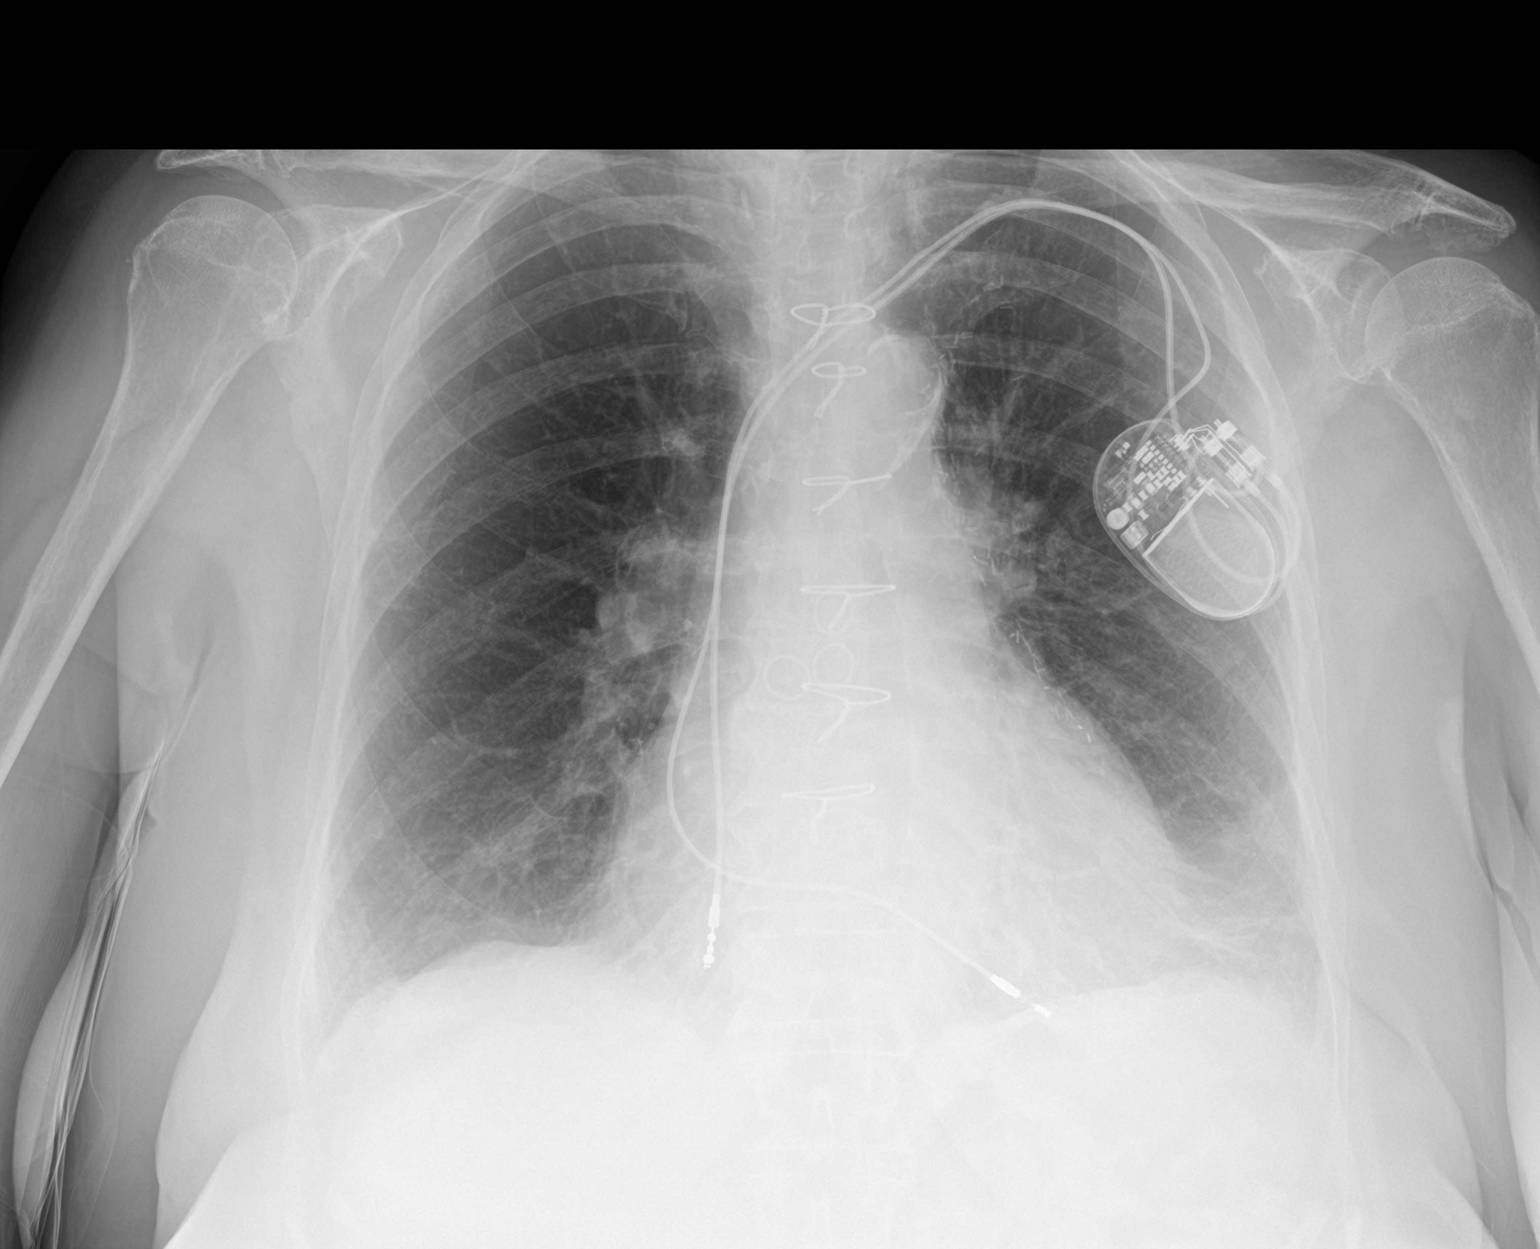

[2 of 2 positions shown; findings below may reference images not displayed]

FINDINGS: Cardiac shadow is stable. Pacing device is noted and stable.
Postsurgical changes are seen. Diffuse aortic calcifications are
noted. No focal infiltrate or sizable effusion is seen. Some minimal
basilar scarring is noted on the left. No bony abnormality is seen.
IMPRESSION: No active cardiopulmonary disease.

## 2019-03-29 ENCOUNTER — Encounter: Payer: Medicare Other | Admitting: Internal Medicine

## 2019-04-01 DIAGNOSIS — L570 Actinic keratosis: Secondary | ICD-10-CM | POA: Diagnosis not present

## 2019-04-01 DIAGNOSIS — L814 Other melanin hyperpigmentation: Secondary | ICD-10-CM | POA: Diagnosis not present

## 2019-04-01 DIAGNOSIS — D1801 Hemangioma of skin and subcutaneous tissue: Secondary | ICD-10-CM | POA: Diagnosis not present

## 2019-04-01 DIAGNOSIS — L820 Inflamed seborrheic keratosis: Secondary | ICD-10-CM | POA: Diagnosis not present

## 2019-04-01 DIAGNOSIS — L821 Other seborrheic keratosis: Secondary | ICD-10-CM | POA: Diagnosis not present

## 2019-04-01 DIAGNOSIS — D485 Neoplasm of uncertain behavior of skin: Secondary | ICD-10-CM | POA: Diagnosis not present

## 2019-05-10 DIAGNOSIS — W19XXXA Unspecified fall, initial encounter: Secondary | ICD-10-CM | POA: Diagnosis not present

## 2019-05-10 DIAGNOSIS — M79604 Pain in right leg: Secondary | ICD-10-CM | POA: Diagnosis not present

## 2019-05-10 DIAGNOSIS — M1711 Unilateral primary osteoarthritis, right knee: Secondary | ICD-10-CM | POA: Diagnosis not present

## 2019-05-12 ENCOUNTER — Encounter: Payer: Medicare Other | Admitting: Internal Medicine

## 2019-05-14 ENCOUNTER — Encounter: Payer: Medicare Other | Admitting: Internal Medicine

## 2019-05-14 ENCOUNTER — Telehealth: Payer: Self-pay

## 2019-05-14 NOTE — Telephone Encounter (Signed)
Pt was scheduled for a routine follow up 05/14/19. Called daughter to confirm she indeed wanted an appt with Dr. Caryl Comes as she canceled her last appt in Sept. Pt's PPM is no longer followed after reaching ERI and pt was scheduled off an overdue recall. Per pt's daughter, she does not need to be seen by Dr. Caryl Comes at this time and will cancel her appt for today. Pt's daughter states she will call and schedule appt's with Dr. Caryl Comes as needed. Pt has no needs at this time.

## 2019-06-08 ENCOUNTER — Other Ambulatory Visit: Payer: Medicare Other | Admitting: Internal Medicine

## 2019-06-08 ENCOUNTER — Other Ambulatory Visit: Payer: Self-pay

## 2019-06-08 DIAGNOSIS — Z7189 Other specified counseling: Secondary | ICD-10-CM

## 2019-06-08 DIAGNOSIS — Z515 Encounter for palliative care: Secondary | ICD-10-CM

## 2019-06-08 NOTE — Progress Notes (Signed)
Jan 5th, 2021 [facility] Palliative Care Consult Note Telephone: [phone]  Fax: [phone]  PATIENT NAME: [name] [name] DOB: December 11, 1920 MRN: [id number] [place] PRIMARY CARE PROVIDER:   Lajean Manes, MD Dr. Adam Phenix (Cardiology)  REFERRING PROVIDER:  Lajean Manes, [place]. [address] [employer] [address] [address]  RESPONSIBLE PARTY: (dtr/HCPOA) Pollyann [phone], [phone]. Delphina Cahill (dtr) [phone], [phone]. Ileene Patrick (son-in-law) [phone], [phone]. Blanchie Dessert (granddaughter) [phone]. Paradise Hecksel ([phone], ([phone].  ASSESSMENT / RECOMMENDATIONS:  1. Advance Care Planning: A. Directives: has DNR. Completed MOST with daughter/HCPOA Hoyle Sauer: Comfort level of care. Antibiotics: determine at the time of need. No to IVFs. No to Tube Feedings. Both forms uploaded into [facility]. B. Goals of Care: Keep patient comfortable; forgo aggressive measures/avoid hospitalizations. Her PM battery is at EOL and choice made not to change out. Anticipate transition to hospice care when patient meets eligibility criteria.  2. Cognitive/Functional status: Patient is baseline pleasantly confused and forgetful. Daughter reports her confusion has progressed over the last month. Able to converse in complete sentences but easily off topic/some difficulty with word finding. Currently her speech is clear, but recently for a few days she exhibited mumbled/ garbled speech, inability to ambulate, and swollen R foot. Now those symptoms and signs have resolved, and she is back to baseline. Forgets family members. Patient needs assists for hygiene and dressing. Needs tactile and verbal cuing / assist to transfer. Ambulates with unsteady gait with walker. [place] about 6-8 weeks ago; misstep as entering home. Fell backwards and tore meniscus in her R knee. Pain in that knee managed  by Tylenol bid. Daughter has noted increased foul-smelling urine. Patient is incontinent of urine but will utilize toilet when brought. Weight appears stable; approximately 180lbs. Height 5'1". Normally good appetite but diminished over the last few days. Patient is going to be tested for COVID tomorrow, as she was exposed to COVID positive care giver ([facility]). Patient hasn't had symptoms worrisome for COVID-19 infection.  - will call in antbx for possible UTI setting mental status changes and foul urine. Script sent to [facility]. Macrobid 100mg  bid x 5 days.   -discussed use of cloth incontinent pads to aid with clean up nocturnal incontinence.  3. Family Supports: Patient resides in the home of her very supportive daughter Hoyle Sauer and son-in-law Marya Amsler. Attended [facility] M-F aprox 10am-4pm but those services just interrupted d/t a COVID infection of a caregiver there. Patient is widowed; another daughter lives close by but has significant health care concerns and can't contribute significantly to patient's care. Hoyle Sauer is hesitant to hire outside care (strangers in the home). She is showing some caregiver fatigue but copes by resourcing educational information regarding dementia disease processes/coping, and of patient's PCP Dr. Felipa Eth.  4. Follow up Palliative Care Visit: Friday 07/16/2019 @4 :30pm  I spent 60 minutes providing this consultation from 3:30pm to 4:30pm. More than 50% of the time in this consultation was spent coordinating communication.   HISTORY OF PRESENT ILLNESS:  [name] [name] is a 86 y.o. female with medical h/o Alzheimer's disease, CAD (CABG), Pacemaker (complete heart block; PM battery at [facility] but decision not to replace), depression, CKD stage 3, and DDD. Palliative Care was asked to help address goals of care.   CODE STATUS: DNR  PPS: 40%  HOSPICE ELIGIBILITY/DIAGNOSIS: Patient has a pacemaker that reached ERI (elective  replacement  indicator) April of 2020. PM was initially placed for complete heart block. Decision was made not to replace battery. When battery depleted, anticipate hospice eligibility depending on HR response. She is almost 100% dependent for ventricular pacing.  PAST MEDICAL HISTORY:  Past Medical History:  Diagnosis Date  . Anxiety     on med bid since 2007  . CAD (coronary artery disease)   . Carotid arterial disease (HCC)    sp cae   . Complete heart block ([place])   . Decreased hearing    left ear  . Hyperkalemia   . Hyperlipidemia   . LV dysfunction    intercurrent normalization of lv function- cath 2007  . Macular degeneration    impaired vision  . Mitral regurgitation    moderate  . Osteoarthritis   . Pacemaker -[place]    Date implanted 1996; generator replacement 2010  . Syncope     SOCIAL HX:  Social History   Tobacco Use  . Smoking status: Former Research scientist (life sciences)  . Smokeless tobacco: Never Used  . Tobacco comment: pt stopped a long time ago  Substance Use Topics  . Alcohol use: No    ALLERGIES:  Allergies  Allergen Reactions  . Aspirin Other (See Comments)    Md told her not to take-kidneys or intestines?  . Ibuprofen Other (See Comments)    Md told her not to take-kidneys or intestines?  . Hydrocodone Other (See Comments)    Upset stomach     PERTINENT MEDICATIONS:  Outpatient Encounter Medications as of 06/08/2019  Medication Sig  . acetaminophen (TYLENOL) 325 MG tablet Take 650 mg by mouth every 6 (six) hours as needed.    . carvedilol (COREG) 25 MG tablet Take 25 mg by mouth 2 (two) times daily with a meal.  . Cholecalciferol (VITAMIN D3) 5000 UNITS CAPS Take 1 capsule by mouth daily.  . Cyanocobalamin (B-12 PO) Vitamin B12, 1 tablet by mouth twice weekly.  Marland Kitchen docusate sodium (COLACE) 100 MG capsule Take 100 mg by mouth daily.  . furosemide (LASIX) 20 MG tablet Take 2 tablets (40 mg total) by mouth every other day. Please keep upcoming appt for future refills thank you.    . Lactobacillus (PROBIOTIC ACIDOPHILUS PO) Take 1 capsule by mouth 2 (two) times daily.  . Melatonin 5 MG TABS Take 3 tablets by mouth daily.   . Multiple Vitamins-Minerals (CENTRUM SILVER PO) Take 1 tablet by mouth daily.   . Multiple Vitamins-Minerals (ICAPS PO) Take by mouth. ICAPS Multiple Vitamin, 1 capsule by mouth daily.  Marland Kitchen MYRBETRIQ 50 MG TB24 tablet Take 50 mg by mouth daily.  Marland Kitchen omeprazole (PRILOSEC) 20 MG capsule Take 1 capsule (20 mg total) by mouth daily.  Vladimir Faster Glycol-Propyl Glycol (SYSTANE) 0.4-0.3 % SOLN Place 1 drop into both eyes daily.  . Simethicone (PHAZYME) 180 MG CAPS Take 1 capsule by mouth 2 (two) times daily.  . sodium chloride (MURO 128) 5 % ophthalmic ointment Place 1 application into both eyes at bedtime.  Marland Kitchen venlafaxine XR (EFFEXOR-XR) 37.5 MG 24 hr capsule Take 37.5 mg by mouth daily. with food   No facility-administered encounter medications on file as of 06/08/2019.    PHYSICAL EXAM:   BP 144/80, HR 76 and regular. O2 sat 96%. RR 16 General: Well nourished, pleasant, baseline confusion. Oriented to self. Able to follow simple commands. Ambulates slowly with shuffling gait/walker. Needs much assist and cuing to transfer Cardiovascular: regular rate and rhythm. Systolic murmur Pulmonary: clear  fields Abdomen: soft, nontender, + bowel sounds, obese Extremities: no edema, no joint deformities Skin: no rashes Neurological: Weakness but otherwise non-focal  Julianne Handler, NP

## 2019-06-09 ENCOUNTER — Encounter: Payer: Self-pay | Admitting: Internal Medicine

## 2019-06-14 ENCOUNTER — Telehealth: Payer: Self-pay | Admitting: Internal Medicine

## 2019-06-14 NOTE — Telephone Encounter (Signed)
Noon: TC from [facility] Daughter [place].  [name] [name] and I had discussed patient's possible Hospice eligibility d/t family decision not to change out patient's pace maker battery, though it had reached point of recommended change out 8 months earlier.   In consultation with our Hospice physician Dr. Karie Georges, pace maker end of life did not in and of itself allow patient to meet Hospice criteria. I shared this with [name] [name].  [name] [name] reports that patient is doing well, despite her COVID positive status. However, [name] [name] and her husband who also have COVID are feeling flu-like symptoms. They were hoping for the extra assistance for patient, that Hospice would have provided.  Violeta Gelinas NP-C [phone]

## 2019-06-17 ENCOUNTER — Telehealth: Payer: Self-pay | Admitting: Internal Medicine

## 2019-06-17 NOTE — Telephone Encounter (Signed)
9am;  TC last eve from [facility] daughter Hoyle Sauer, who reports patient with progressive symptoms of COVID, with temp of 101.2, and no oral consumption of food or fluid

## 2019-06-17 NOTE — Telephone Encounter (Signed)
(  continuation of prior TC note)  Patient unable to consume oral food or drink since am of 06/15/2019. Increased lethargy and somnolence. Now uable to self transfer and incontinent of bladder. Hoyle Sauer reports that Dr. Ellin Goodie recommended Hospice services. I left a message this morning with my contact information with Dr. Carlyle Lipa nurse Marye Round, asking for permission to refer for Hospice. I gave my contact number.  Violeta Gelinas NP-C [phone]

## 2019-07-05 DEATH — deceased
